# Patient Record
Sex: Male | Born: 1960
Health system: Southern US, Community
[De-identification: ages and names within clinical notes are randomized; demographics above are authoritative.]

## PROBLEM LIST (undated history)

## (undated) DIAGNOSIS — G959 Disease of spinal cord, unspecified: Secondary | ICD-10-CM

## (undated) DIAGNOSIS — K649 Unspecified hemorrhoids: Secondary | ICD-10-CM

## (undated) DIAGNOSIS — F329 Major depressive disorder, single episode, unspecified: Secondary | ICD-10-CM

## (undated) DIAGNOSIS — F32A Depression, unspecified: Secondary | ICD-10-CM

## (undated) DIAGNOSIS — D869 Sarcoidosis, unspecified: Secondary | ICD-10-CM

## (undated) DIAGNOSIS — M1990 Unspecified osteoarthritis, unspecified site: Secondary | ICD-10-CM

## (undated) DIAGNOSIS — F419 Anxiety disorder, unspecified: Secondary | ICD-10-CM

## (undated) DIAGNOSIS — K6289 Other specified diseases of anus and rectum: Secondary | ICD-10-CM

## (undated) HISTORY — DX: Other specified diseases of anus and rectum: K62.89

## (undated) HISTORY — PX: COLONOSCOPY: SHX5424

## (undated) HISTORY — DX: Sarcoidosis, unspecified: D86.9

## (undated) HISTORY — PX: CARDIAC CATHETERIZATION: SHX172

## (undated) HISTORY — DX: Unspecified hemorrhoids: K64.9

## (undated) HISTORY — DX: Disease of spinal cord, unspecified: G95.9

## (undated) HISTORY — PX: HAND TENDON SURGERY: SHX663

## (undated) HISTORY — PX: HEMORRHOID SURGERY: SHX153

## (undated) HISTORY — PX: ANTERIOR CRUCIATE LIGAMENT REPAIR: SHX115

## (undated) HISTORY — DX: Unspecified osteoarthritis, unspecified site: M19.90

---

## 2004-04-07 ENCOUNTER — Ambulatory Visit (HOSPITAL_COMMUNITY): Admission: RE | Admit: 2004-04-07 | Discharge: 2004-04-07 | Payer: Self-pay

## 2004-04-07 ENCOUNTER — Encounter (INDEPENDENT_AMBULATORY_CARE_PROVIDER_SITE_OTHER): Payer: Self-pay | Admitting: Specialist

## 2004-12-01 ENCOUNTER — Inpatient Hospital Stay (HOSPITAL_COMMUNITY): Admission: RE | Admit: 2004-12-01 | Discharge: 2004-12-02 | Payer: Self-pay | Admitting: Neurosurgery

## 2005-03-12 HISTORY — PX: BACK SURGERY: SHX140

## 2005-03-12 HISTORY — PX: SPHINCTEROTOMY: SHX5279

## 2008-11-29 ENCOUNTER — Ambulatory Visit (HOSPITAL_BASED_OUTPATIENT_CLINIC_OR_DEPARTMENT_OTHER): Admission: RE | Admit: 2008-11-29 | Discharge: 2008-11-29 | Payer: Self-pay | Admitting: General Surgery

## 2008-11-29 ENCOUNTER — Encounter (INDEPENDENT_AMBULATORY_CARE_PROVIDER_SITE_OTHER): Payer: Self-pay | Admitting: General Surgery

## 2008-12-28 ENCOUNTER — Encounter: Admission: RE | Admit: 2008-12-28 | Discharge: 2008-12-28 | Payer: Self-pay | Admitting: Neurosurgery

## 2009-07-08 ENCOUNTER — Encounter: Admission: RE | Admit: 2009-07-08 | Discharge: 2009-07-08 | Payer: Self-pay | Admitting: Neurosurgery

## 2009-11-25 ENCOUNTER — Encounter: Admission: RE | Admit: 2009-11-25 | Discharge: 2009-11-25 | Payer: Self-pay | Admitting: Neurosurgery

## 2010-06-16 LAB — POCT I-STAT 4, (NA,K, GLUC, HGB,HCT)
Hemoglobin: 15.6 g/dL (ref 13.0–17.0)
Potassium: 3.9 mEq/L (ref 3.5–5.1)
Sodium: 139 mEq/L (ref 135–145)

## 2010-07-28 NOTE — H&P (Signed)
NAME:  Adam Burnett, Adam Burnett              ACCOUNT NO.:  o   MEDICAL RECORD NO.:  [id number]          PATIENT TYPE:  INP   LOCATION:  NA                           FACILITY:  [facility]   PHYSICIAN:  Hilda Lias, M.D.   DATE OF BIRTH:  [birth year]   DATE OF ADMISSION:  12/01/2004  DATE OF DISCHARGE:                                HISTORY & PHYSICAL   HISTORY:  Adam Burnett is a gentleman who had been seen by me for more  than one year, complaining of back pain which gets better with conservative  treatment.  Lately the pain is more intense.  It is going down to the left  leg, associated with weakness and a burning sensation.  He is miserable.  In  the past he has had conservative treatment.  I saw him on September 27, 2003.  At  that time we made a diagnosis of degenerative disk disease at the level of  L4-5 with a herniated disk at the level of L5-S1.  He continues to come to  my office.  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  Nevertheless, he came to see me about three  weeks ago with worsening of the pain, down the left leg.  The x-rays showed  that he has a herniated disk at the level of L4-5, intra and extra-  foraminal, as well as foraminal stenosis at the level of L5-S1 on the left  side.  He came to the point that he was worse and wants to proceed with  surgery as soon as possible.   PAST MEDICAL HISTORY:  Negative.   ALLERGIES:  PENICILLIN.   SOCIAL HISTORY:  He smokes one pack a day.   REVIEW OF SYSTEMS:  Positive for back pain and left leg pain and anxiety.   PHYSICAL EXAMINATION:  HEENT:  Ears/Nose/Throat normal.  NECK:  Normal.  LUNGS:  Clear.  HEART:  Sounds normal.  ABDOMEN:  Normal.  EXTREMITIES:  Normal pulses.  NEUROLOGIC:  He has weakening of the dorsiflexion of the left foot which is  3/5, with straight leg raising being positive  at 45 degrees on the left  side.  On the right side is about 80 degrees.  He has a decrease of the left  ankle jerk.   The x-rays show that he has a herniated disk at the level of 4-5 intra-  foraminal as well as extra-foraminal.   CLINICAL IMPRESSION:  Left L4-5 herniated disk with intra-foraminal and  extra-foraminal component, left L5-S1 foraminal stenosis.   RECOMMENDATIONS:  The patient is being admitted for an L4-5 diskectomy with  intra-foraminal and extra-foraminal approach, as well as an L5 foraminotomy.  He knows about the risks such as infection, CSF leak, worsening of the pain,  paralysis, no improvement whatsoever because of the chronicity of the pain.           ______________________________  Hilda Lias, M.D.     EB/MEDQ  D:  12/01/2004  T:  12/01/2004  Job:  [id number]

## 2010-07-28 NOTE — Op Note (Signed)
NAME:  Adam Burnett, Adam Burnett NO.:  [id number]   MEDICAL RECORD NO.:  [id number]          PATIENT TYPE:  AMB   LOCATION:  DAY                          FACILITY:  [facility]   PHYSICIAN:  Lorre Munroe., M.D.DATE OF BIRTH:  [birth year]   DATE OF PROCEDURE:  04/07/2004  DATE OF DISCHARGE:                                 OPERATIVE REPORT   PREOPERATIVE DIAGNOSIS:  Symptomatic hemorrhoids.   POSTOPERATIVE DIAGNOSIS:  Symptomatic hemorrhoids.   OPERATION/PROCEDURE:  Hemorrhoidecomy.   SURGEON:  Lebron Conners, M.D.   ANESTHESIA:  General and local.   DESCRIPTION OF PROCEDURE:  The patient was anesthetized and placed in the  dorsal lithotomy position and perineum prepped and draped.  I thoroughly  examined the anal canal.  There were very small noninflamed, nonthrombosed  external skin tags.  There was a large hemorrhoid just on the right side  posteriorly and associated with it was a smaller one just adjacent to it.  There was also a sizable anterior hemorrhoid on the right.  We grasped the  largest of the hemorrhoids with a Kelly clamp across it longitudinally and  amputated it and oversewed the wound with 2-0 chromic stitch.  I treated the  anterior hemorrhoid in the same way and I treated the small hemorrhoid with  rubber-band ligation.  This did not narrow the anus at all.  I saw no  evidence of any inflammatory disease, fissure or infection.  I thoroughly  infiltrated the entire anal area, in particularly the area of the  hemorrhoids with long-acting local anesthetic.  The patient tolerated the  operation well.      WB/MEDQ  D:  04/07/2004  T:  04/07/2004  Job:  [id number]

## 2010-07-28 NOTE — Op Note (Signed)
NAMEMarland Kitchen  Burnett, Adam Burnett NO.:  [id number]   MEDICAL RECORD NO.:  [id number]          PATIENT TYPE:  INP   LOCATION:  3005                         FACILITY:  [facility]   PHYSICIAN:  Hilda Lias, M.D.   DATE OF BIRTH:  1960/03/30   DATE OF PROCEDURE:  12/01/2004  DATE OF DISCHARGE:                                 OPERATIVE REPORT   PREOPERATIVE DIAGNOSES:  1.  Left lumbar 4-5 herniated disk with intra- and extraforaminal component.  2.  Left lumbar 5, sacral 1 stenosis.   POSTOPERATIVE DIAGNOSES:  1.  Left lumbar 4-5 herniated disk with intra- and extraforaminal      components.  2.  Left lumbar 5, sacral 1 stenosis.   OPERATION PERFORMED:  1.  Left lumbar 4-5 diskectomy with intra- and extraforaminal approach.  2.  Left lumbar 5, sacral 1 foraminotomy.  3.  Microscopic surgery.   SURGEON:  Hilda Lias, M.D.   ASSISTANT:  Coletta Memos, M.D.   CLINICAL HISTORY:  The patient has been complaining of back pain radiating  to the left leg for more than two years.  I have been following him in my  office, but lately he has been getting worse, therefore he decided to  undergo the surgery.  The risks were explained __________ .   DESCRIPTION OF OPERATION:  The patient was brought to the OR and he was  positioned on the __________ .  The gentleman was quite heavy and it was  difficult to feel the spinous process.  Then a midline incision was made and  the muscles were retracted laterally.  We took an x-ray and this showed we  were at the L4-5 level.  From then on with the microscope we drilled the  lamina; the upper of L4 and the lower of L5.  The yellow ligament was also  excised.  Then we went extraforaminal and we drilled the inferior and  lateral facets.  The inner transverse ligament was also excised.  We  identified the L4 nerve root and from this side we identified the L5 nerve  root.  We opened the disk space at the L4-5 intraforaminal level and  __________   amount of degenerative disk was removed.  Then we went  laterally.  We retracted the L4 nerve root and we went into the disk.  The  disk was calcified and we had to drill it as well as remove it with the  curet and the Kerrison punch.  At the end we had a good decompression of the  intraforaminal.  Then we went laterally into the L5, S1 interspace. From  removal of the upper part of S1 was done.  Thick ligament was also excised.  With the drill as well as the 2 and 3 mm Kerrison punches foraminotomies to  decompress the S1 nerve root were obtained.  At the end we had a good  decompression.   Hemostasis was done with bipolar electrocautery.  The area was irrigated.  Fentanyl and Depo-Medrol were left in the epidural space.  The wound was  closed with Vicryl and Steri-strips.  ______________________________  Hilda Lias, M.D.     EB/MEDQ  D:  12/01/2004  T:  12/02/2004  Job:  [id number]

## 2012-01-18 ENCOUNTER — Ambulatory Visit (INDEPENDENT_AMBULATORY_CARE_PROVIDER_SITE_OTHER): Payer: Self-pay | Admitting: General Surgery

## 2012-01-18 ENCOUNTER — Encounter (INDEPENDENT_AMBULATORY_CARE_PROVIDER_SITE_OTHER): Payer: Self-pay | Admitting: General Surgery

## 2012-01-18 VITALS — BP 132/84 | HR 80 | Temp 97.6°F | Resp 14 | Ht 74.0 in | Wt 202.8 lb

## 2012-01-18 DIAGNOSIS — K649 Unspecified hemorrhoids: Secondary | ICD-10-CM

## 2012-01-18 MED ORDER — HYDROCORTISONE ACETATE 25 MG RE SUPP: 25.0000 mg | Freq: Two times a day (BID) | RECTAL | Status: DC

## 2012-01-18 NOTE — Progress Notes (Signed)
Subjective:     Patient ID: Adam Burnett, male   DOB: [birth year], 51 y.o.   MRN: [id number]  HPI The patient is a 51 year old male who presented on 2 previous hemorrhoid surgeries. Patient is also lateral internal sphincterotomy by Dr. Zachery Dakins for an anal fissure.the patient states that approximately 2 months ago patient began having problems with hemorrhoids again and pain on defecation. Patient states he is normal regular without limits takes milk of magnesia every night and has normal bowel movements the morning after.  Review of Systems  Constitutional: Negative.   HENT: Negative.   Eyes: Negative.   Respiratory: Negative.   Cardiovascular: Negative.   Gastrointestinal: Negative.   Musculoskeletal: Negative.   Neurological: Negative.        Objective:   Physical Exam  Constitutional: He is oriented to person, place, and time. He appears well-developed and well-nourished.  HENT:  Head: Normocephalic and atraumatic.  Eyes: Conjunctivae normal and EOM are normal. Pupils are equal, round, and reactive to light.  Neck: Normal range of motion. Neck supple.  Cardiovascular: Normal rate and regular rhythm.   Pulmonary/Chest: Effort normal and breath sounds normal.  Abdominal: Soft. Bowel sounds are normal.  Genitourinary:     Musculoskeletal: Normal range of motion.  Neurological: He is alert and oriented to person, place, and time.       Assessment:     51 year old male with symptomatic hemorrhoids and redundant hemorrhoid tissue.    Plan:     1. At this point secondary to pain all prescribed Anusol-HC suppository.  2. This is the patient should  His hemorrhoid issues continue to become symptomatic patient may require excision of this hemorrhoid tissue. He stated that he would like to be seen next year secondary to insurance purposes.the patient followed Dr. Maisie Fus for surgical referral.

## 2012-02-04 ENCOUNTER — Other Ambulatory Visit (INDEPENDENT_AMBULATORY_CARE_PROVIDER_SITE_OTHER): Payer: Self-pay

## 2012-02-04 ENCOUNTER — Telehealth (INDEPENDENT_AMBULATORY_CARE_PROVIDER_SITE_OTHER): Payer: Self-pay

## 2012-02-04 DIAGNOSIS — K649 Unspecified hemorrhoids: Secondary | ICD-10-CM

## 2012-02-04 MED ORDER — HYDROCORTISONE ACETATE 25 MG RE SUPP: 25.0000 mg | Freq: Two times a day (BID) | RECTAL | Status: DC

## 2012-02-04 NOTE — Progress Notes (Signed)
Anusol refilled authorized by Dr. Derrell Lolling on 02/04/12.

## 2012-02-04 NOTE — Telephone Encounter (Signed)
Patient notified of refill for Anusol, refill sent to [facility] in [place], [place]

## 2015-07-06 DIAGNOSIS — M25561 Pain in right knee: Secondary | ICD-10-CM | POA: Diagnosis not present

## 2015-07-06 DIAGNOSIS — M1711 Unilateral primary osteoarthritis, right knee: Secondary | ICD-10-CM | POA: Diagnosis not present

## 2015-07-06 DIAGNOSIS — S83511S Sprain of anterior cruciate ligament of right knee, sequela: Secondary | ICD-10-CM | POA: Diagnosis not present

## 2015-09-06 DIAGNOSIS — F329 Major depressive disorder, single episode, unspecified: Secondary | ICD-10-CM | POA: Diagnosis not present

## 2015-09-06 DIAGNOSIS — Z6826 Body mass index (BMI) 26.0-26.9, adult: Secondary | ICD-10-CM | POA: Diagnosis not present

## 2015-09-06 DIAGNOSIS — R5382 Chronic fatigue, unspecified: Secondary | ICD-10-CM | POA: Diagnosis not present

## 2015-09-06 DIAGNOSIS — M5137 Other intervertebral disc degeneration, lumbosacral region: Secondary | ICD-10-CM | POA: Diagnosis not present

## 2015-09-06 DIAGNOSIS — E663 Overweight: Secondary | ICD-10-CM | POA: Diagnosis not present

## 2015-09-29 DIAGNOSIS — F418 Other specified anxiety disorders: Secondary | ICD-10-CM | POA: Diagnosis not present

## 2015-09-29 DIAGNOSIS — Z6826 Body mass index (BMI) 26.0-26.9, adult: Secondary | ICD-10-CM | POA: Diagnosis not present

## 2015-10-27 DIAGNOSIS — M9983 Other biomechanical lesions of lumbar region: Secondary | ICD-10-CM | POA: Diagnosis not present

## 2015-10-27 DIAGNOSIS — Z79891 Long term (current) use of opiate analgesic: Secondary | ICD-10-CM | POA: Diagnosis not present

## 2015-10-27 DIAGNOSIS — M5137 Other intervertebral disc degeneration, lumbosacral region: Secondary | ICD-10-CM | POA: Diagnosis not present

## 2015-10-27 DIAGNOSIS — F418 Other specified anxiety disorders: Secondary | ICD-10-CM | POA: Diagnosis not present

## 2015-12-28 DIAGNOSIS — R079 Chest pain, unspecified: Secondary | ICD-10-CM | POA: Diagnosis not present

## 2015-12-28 DIAGNOSIS — Z6828 Body mass index (BMI) 28.0-28.9, adult: Secondary | ICD-10-CM | POA: Diagnosis not present

## 2015-12-30 DIAGNOSIS — R079 Chest pain, unspecified: Secondary | ICD-10-CM | POA: Diagnosis not present

## 2016-01-03 DIAGNOSIS — M5412 Radiculopathy, cervical region: Secondary | ICD-10-CM | POA: Diagnosis not present

## 2016-01-03 DIAGNOSIS — I519 Heart disease, unspecified: Secondary | ICD-10-CM | POA: Diagnosis not present

## 2016-01-03 DIAGNOSIS — Z6828 Body mass index (BMI) 28.0-28.9, adult: Secondary | ICD-10-CM | POA: Diagnosis not present

## 2016-01-03 DIAGNOSIS — F419 Anxiety disorder, unspecified: Secondary | ICD-10-CM | POA: Diagnosis not present

## 2016-02-14 DIAGNOSIS — Z6828 Body mass index (BMI) 28.0-28.9, adult: Secondary | ICD-10-CM | POA: Diagnosis not present

## 2016-02-14 DIAGNOSIS — Z862 Personal history of diseases of the blood and blood-forming organs and certain disorders involving the immune mechanism: Secondary | ICD-10-CM | POA: Diagnosis not present

## 2016-02-14 DIAGNOSIS — D869 Sarcoidosis, unspecified: Secondary | ICD-10-CM | POA: Diagnosis not present

## 2016-02-14 DIAGNOSIS — R079 Chest pain, unspecified: Secondary | ICD-10-CM | POA: Diagnosis not present

## 2016-02-14 DIAGNOSIS — I10 Essential (primary) hypertension: Secondary | ICD-10-CM | POA: Diagnosis not present

## 2016-02-21 DIAGNOSIS — Z7982 Long term (current) use of aspirin: Secondary | ICD-10-CM | POA: Diagnosis not present

## 2016-02-21 DIAGNOSIS — Z86010 Personal history of colonic polyps: Secondary | ICD-10-CM | POA: Diagnosis not present

## 2016-02-21 DIAGNOSIS — I119 Hypertensive heart disease without heart failure: Secondary | ICD-10-CM | POA: Diagnosis not present

## 2016-02-21 DIAGNOSIS — Z880 Allergy status to penicillin: Secondary | ICD-10-CM | POA: Diagnosis not present

## 2016-02-21 DIAGNOSIS — F17210 Nicotine dependence, cigarettes, uncomplicated: Secondary | ICD-10-CM | POA: Diagnosis not present

## 2016-02-21 DIAGNOSIS — I10 Essential (primary) hypertension: Secondary | ICD-10-CM | POA: Diagnosis not present

## 2016-02-21 DIAGNOSIS — J449 Chronic obstructive pulmonary disease, unspecified: Secondary | ICD-10-CM | POA: Diagnosis not present

## 2016-02-21 DIAGNOSIS — R0789 Other chest pain: Secondary | ICD-10-CM | POA: Diagnosis not present

## 2016-02-21 DIAGNOSIS — D869 Sarcoidosis, unspecified: Secondary | ICD-10-CM | POA: Diagnosis not present

## 2016-05-17 DIAGNOSIS — Z6828 Body mass index (BMI) 28.0-28.9, adult: Secondary | ICD-10-CM | POA: Diagnosis not present

## 2016-05-17 DIAGNOSIS — J208 Acute bronchitis due to other specified organisms: Secondary | ICD-10-CM | POA: Diagnosis not present

## 2016-06-07 DIAGNOSIS — J208 Acute bronchitis due to other specified organisms: Secondary | ICD-10-CM | POA: Diagnosis not present

## 2016-08-29 DIAGNOSIS — Z6828 Body mass index (BMI) 28.0-28.9, adult: Secondary | ICD-10-CM | POA: Diagnosis not present

## 2016-08-29 DIAGNOSIS — M5137 Other intervertebral disc degeneration, lumbosacral region: Secondary | ICD-10-CM | POA: Diagnosis not present

## 2016-11-05 DIAGNOSIS — M5137 Other intervertebral disc degeneration, lumbosacral region: Secondary | ICD-10-CM | POA: Diagnosis not present

## 2016-11-05 DIAGNOSIS — Z6827 Body mass index (BMI) 27.0-27.9, adult: Secondary | ICD-10-CM | POA: Diagnosis not present

## 2016-11-17 DIAGNOSIS — M5137 Other intervertebral disc degeneration, lumbosacral region: Secondary | ICD-10-CM | POA: Diagnosis not present

## 2016-11-17 DIAGNOSIS — M48061 Spinal stenosis, lumbar region without neurogenic claudication: Secondary | ICD-10-CM | POA: Diagnosis not present

## 2016-12-05 DIAGNOSIS — M4727 Other spondylosis with radiculopathy, lumbosacral region: Secondary | ICD-10-CM | POA: Diagnosis not present

## 2016-12-05 DIAGNOSIS — M5416 Radiculopathy, lumbar region: Secondary | ICD-10-CM | POA: Diagnosis not present

## 2016-12-05 DIAGNOSIS — M5127 Other intervertebral disc displacement, lumbosacral region: Secondary | ICD-10-CM | POA: Diagnosis not present

## 2016-12-05 DIAGNOSIS — M549 Dorsalgia, unspecified: Secondary | ICD-10-CM | POA: Diagnosis not present

## 2016-12-05 DIAGNOSIS — M4150 Other secondary scoliosis, site unspecified: Secondary | ICD-10-CM | POA: Diagnosis not present

## 2016-12-25 ENCOUNTER — Other Ambulatory Visit (HOSPITAL_COMMUNITY): Payer: Self-pay | Admitting: Neurological Surgery

## 2016-12-25 DIAGNOSIS — M4180 Other forms of scoliosis, site unspecified: Secondary | ICD-10-CM

## 2016-12-25 DIAGNOSIS — M4150 Other secondary scoliosis, site unspecified: Principal | ICD-10-CM

## 2016-12-31 ENCOUNTER — Ambulatory Visit (HOSPITAL_COMMUNITY)
Admission: RE | Admit: 2016-12-31 | Discharge: 2016-12-31 | Disposition: A | Discharge status: Home / Self Care | Payer: BLUE CROSS/BLUE SHIELD | Source: Ambulatory Visit | Attending: Neurological Surgery | Admitting: Neurological Surgery

## 2016-12-31 DIAGNOSIS — M5126 Other intervertebral disc displacement, lumbar region: Secondary | ICD-10-CM | POA: Diagnosis not present

## 2016-12-31 DIAGNOSIS — M48061 Spinal stenosis, lumbar region without neurogenic claudication: Secondary | ICD-10-CM | POA: Insufficient documentation

## 2016-12-31 DIAGNOSIS — M47814 Spondylosis without myelopathy or radiculopathy, thoracic region: Secondary | ICD-10-CM | POA: Diagnosis not present

## 2016-12-31 DIAGNOSIS — M4807 Spinal stenosis, lumbosacral region: Secondary | ICD-10-CM | POA: Diagnosis not present

## 2016-12-31 DIAGNOSIS — M4150 Other secondary scoliosis, site unspecified: Secondary | ICD-10-CM | POA: Insufficient documentation

## 2017-01-04 DIAGNOSIS — Z6827 Body mass index (BMI) 27.0-27.9, adult: Secondary | ICD-10-CM | POA: Diagnosis not present

## 2017-01-04 DIAGNOSIS — M5137 Other intervertebral disc degeneration, lumbosacral region: Secondary | ICD-10-CM | POA: Diagnosis not present

## 2017-01-10 DIAGNOSIS — M5127 Other intervertebral disc displacement, lumbosacral region: Secondary | ICD-10-CM | POA: Diagnosis not present

## 2017-01-10 DIAGNOSIS — M5416 Radiculopathy, lumbar region: Secondary | ICD-10-CM | POA: Diagnosis not present

## 2017-01-10 DIAGNOSIS — M4150 Other secondary scoliosis, site unspecified: Secondary | ICD-10-CM | POA: Diagnosis not present

## 2017-01-10 DIAGNOSIS — M4727 Other spondylosis with radiculopathy, lumbosacral region: Secondary | ICD-10-CM | POA: Diagnosis not present

## 2017-01-25 ENCOUNTER — Other Ambulatory Visit: Payer: Self-pay

## 2017-01-25 ENCOUNTER — Encounter (HOSPITAL_COMMUNITY): Payer: Self-pay | Admitting: *Deleted

## 2017-01-25 NOTE — Progress Notes (Signed)
Patient verbalized understanding of instructions   7 days prior to surgery STOP taking any Aspirin(unless otherwise instructed by your surgeon), Aleve, Naproxen, Ibuprofen, Motrin, Advil, Goody's, BC's, all herbal medications, fish oil, and all vitamins

## 2017-01-28 ENCOUNTER — Inpatient Hospital Stay (HOSPITAL_COMMUNITY)
Admission: RE | Admit: 2017-01-28 | Discharge: 2017-01-30 | DRG: 460 | Disposition: A | Discharge status: Home / Self Care | Payer: BLUE CROSS/BLUE SHIELD | Source: Ambulatory Visit | Attending: Neurological Surgery | Admitting: Neurological Surgery

## 2017-01-28 ENCOUNTER — Inpatient Hospital Stay (HOSPITAL_COMMUNITY): Payer: BLUE CROSS/BLUE SHIELD | Admitting: Certified Registered Nurse Anesthetist

## 2017-01-28 ENCOUNTER — Encounter (HOSPITAL_COMMUNITY): Payer: Self-pay

## 2017-01-28 ENCOUNTER — Other Ambulatory Visit: Payer: Self-pay

## 2017-01-28 ENCOUNTER — Encounter (HOSPITAL_COMMUNITY)
Admission: RE | Disposition: A | Discharge status: Home / Self Care | Payer: Self-pay | Source: Ambulatory Visit | Attending: Neurological Surgery

## 2017-01-28 ENCOUNTER — Inpatient Hospital Stay (HOSPITAL_COMMUNITY): Payer: BLUE CROSS/BLUE SHIELD

## 2017-01-28 DIAGNOSIS — Z880 Allergy status to penicillin: Secondary | ICD-10-CM

## 2017-01-28 DIAGNOSIS — M48061 Spinal stenosis, lumbar region without neurogenic claudication: Secondary | ICD-10-CM | POA: Diagnosis not present

## 2017-01-28 DIAGNOSIS — F329 Major depressive disorder, single episode, unspecified: Secondary | ICD-10-CM | POA: Diagnosis not present

## 2017-01-28 DIAGNOSIS — M4727 Other spondylosis with radiculopathy, lumbosacral region: Principal | ICD-10-CM | POA: Diagnosis present

## 2017-01-28 DIAGNOSIS — F17210 Nicotine dependence, cigarettes, uncomplicated: Secondary | ICD-10-CM | POA: Diagnosis not present

## 2017-01-28 DIAGNOSIS — M4326 Fusion of spine, lumbar region: Secondary | ICD-10-CM | POA: Diagnosis not present

## 2017-01-28 DIAGNOSIS — M549 Dorsalgia, unspecified: Secondary | ICD-10-CM | POA: Diagnosis not present

## 2017-01-28 DIAGNOSIS — F419 Anxiety disorder, unspecified: Secondary | ICD-10-CM | POA: Diagnosis not present

## 2017-01-28 DIAGNOSIS — Z419 Encounter for procedure for purposes other than remedying health state, unspecified: Secondary | ICD-10-CM

## 2017-01-28 HISTORY — PX: TRANSFORAMINAL LUMBAR INTERBODY FUSION (TLIF) WITH PEDICLE SCREW FIXATION 3 LEVEL: SHX6143

## 2017-01-28 HISTORY — DX: Depression, unspecified: F32.A

## 2017-01-28 HISTORY — DX: Major depressive disorder, single episode, unspecified: F32.9

## 2017-01-28 HISTORY — PX: APPLICATION OF ROBOTIC ASSISTANCE FOR SPINAL PROCEDURE: SHX6753

## 2017-01-28 HISTORY — PX: ANTERIOR LAT LUMBAR FUSION: SHX1168

## 2017-01-28 HISTORY — DX: Anxiety disorder, unspecified: F41.9

## 2017-01-28 LAB — TYPE AND SCREEN
ABO/RH(D): O POS
Antibody Screen: NEGATIVE

## 2017-01-28 LAB — BASIC METABOLIC PANEL
ANION GAP: 6 (ref 5–15)
BUN: 14 mg/dL (ref 6–20)
CALCIUM: 8.6 mg/dL — AB (ref 8.9–10.3)
CO2: 27 mmol/L (ref 22–32)
Chloride: 102 mmol/L (ref 101–111)
Creatinine, Ser: 0.86 mg/dL (ref 0.61–1.24)
GFR calc Af Amer: >60 mL/min (ref >60)
Glucose, Bld: 100 mg/dL — ABNORMAL HIGH (ref 65–99)
POTASSIUM: 4.2 mmol/L (ref 3.5–5.1)
SODIUM: 135 mmol/L (ref 135–145)

## 2017-01-28 LAB — CBC
HCT: 41.1 % (ref 39.0–52.0)
Hemoglobin: 13.7 g/dL (ref 13.0–17.0)
MCH: 31.8 pg (ref 26.0–34.0)
MCHC: 33.3 g/dL (ref 30.0–36.0)
MCV: 95.4 fL (ref 78.0–100.0)
PLATELETS: 404 10*3/uL — AB (ref 150–400)
RBC: 4.31 MIL/uL (ref 4.22–5.81)
RDW: 15 % (ref 11.5–15.5)
WBC: 10.4 10*3/uL (ref 4.0–10.5)

## 2017-01-28 LAB — ABO/RH: ABO/RH(D): O POS

## 2017-01-28 SURGERY — ANTERIOR LATERAL LUMBAR FUSION 2 LEVELS
Anesthesia: General

## 2017-01-28 MED ORDER — MIDAZOLAM HCL 2 MG/2ML IJ SOLN
INTRAMUSCULAR | Status: DC | PRN
  Administered 2017-01-28 (×2): 1 mg via INTRAVENOUS

## 2017-01-28 MED ORDER — SUCCINYLCHOLINE CHLORIDE 200 MG/10ML IV SOSY
PREFILLED_SYRINGE | INTRAVENOUS | Status: AC
  Filled 2017-01-28: qty 10

## 2017-01-28 MED ORDER — SODIUM CHLORIDE 0.9% FLUSH: 3.0000 mL | INTRAVENOUS | Status: DC | PRN

## 2017-01-28 MED ORDER — 0.9 % SODIUM CHLORIDE (POUR BTL) OPTIME
TOPICAL | Status: DC | PRN
  Administered 2017-01-28: 1000 mL

## 2017-01-28 MED ORDER — VANCOMYCIN HCL IN DEXTROSE 1-5 GM/200ML-% IV SOLN
INTRAVENOUS | Status: AC
  Filled 2017-01-28: qty 200

## 2017-01-28 MED ORDER — SODIUM CHLORIDE 0.9 % IR SOLN
Status: DC | PRN
  Administered 2017-01-28: 09:00:00

## 2017-01-28 MED ORDER — LIDOCAINE 2% (20 MG/ML) 5 ML SYRINGE
INTRAMUSCULAR | Status: DC | PRN
  Administered 2017-01-28: 60 mg via INTRAVENOUS

## 2017-01-28 MED ORDER — PROPOFOL 500 MG/50ML IV EMUL
INTRAVENOUS | Status: DC | PRN
  Administered 2017-01-28: 100 ug/kg/min via INTRAVENOUS

## 2017-01-28 MED ORDER — OXYCODONE HCL ER 20 MG PO T12A
20.0000 mg | EXTENDED_RELEASE_TABLET | Freq: Two times a day (BID) | ORAL | Status: DC
  Administered 2017-01-28 – 2017-01-30 (×4): 20 mg via ORAL
  Filled 2017-01-28 (×4): qty 1

## 2017-01-28 MED ORDER — VENLAFAXINE HCL ER 75 MG PO CP24
75.0000 mg | ORAL_CAPSULE | Freq: Every day | ORAL | Status: DC
  Administered 2017-01-29 – 2017-01-30 (×2): 75 mg via ORAL
  Filled 2017-01-28 (×2): qty 1

## 2017-01-28 MED ORDER — ROCURONIUM BROMIDE 10 MG/ML (PF) SYRINGE
PREFILLED_SYRINGE | INTRAVENOUS | Status: DC | PRN
  Administered 2017-01-28: 50 mg via INTRAVENOUS
  Administered 2017-01-28: 10 mg via INTRAVENOUS
  Administered 2017-01-28: 30 mg via INTRAVENOUS
  Administered 2017-01-28 (×2): 20 mg via INTRAVENOUS

## 2017-01-28 MED ORDER — LACTATED RINGERS IV SOLN
INTRAVENOUS | Status: DC | PRN
  Administered 2017-01-28: 07:00:00 via INTRAVENOUS

## 2017-01-28 MED ORDER — HYDROMORPHONE HCL 1 MG/ML IJ SOLN
INTRAMUSCULAR | Status: AC
  Filled 2017-01-28: qty 1

## 2017-01-28 MED ORDER — HYDROMORPHONE HCL 1 MG/ML IJ SOLN
0.2500 mg | INTRAMUSCULAR | Status: DC | PRN
  Administered 2017-01-28: 0.25 mg via INTRAVENOUS
  Administered 2017-01-28: 0.5 mg via INTRAVENOUS
  Administered 2017-01-28: 0.25 mg via INTRAVENOUS

## 2017-01-28 MED ORDER — ALBUMIN HUMAN 5 % IV SOLN
INTRAVENOUS | Status: DC | PRN
  Administered 2017-01-28 (×2): via INTRAVENOUS

## 2017-01-28 MED ORDER — LIDOCAINE 2% (20 MG/ML) 5 ML SYRINGE
INTRAMUSCULAR | Status: AC
  Filled 2017-01-28: qty 5

## 2017-01-28 MED ORDER — DOCUSATE SODIUM 100 MG PO CAPS
100.0000 mg | ORAL_CAPSULE | Freq: Two times a day (BID) | ORAL | Status: DC
  Administered 2017-01-28 – 2017-01-30 (×4): 100 mg via ORAL
  Filled 2017-01-28 (×4): qty 1

## 2017-01-28 MED ORDER — SODIUM CHLORIDE 0.9% FLUSH
3.0000 mL | Freq: Two times a day (BID) | INTRAVENOUS | Status: DC
  Administered 2017-01-28 – 2017-01-30 (×4): 3 mL via INTRAVENOUS

## 2017-01-28 MED ORDER — PHENYLEPHRINE HCL 10 MG/ML IJ SOLN
INTRAVENOUS | Status: DC | PRN
  Administered 2017-01-28: 20 ug/min via INTRAVENOUS

## 2017-01-28 MED ORDER — OXYCODONE HCL 5 MG/5ML PO SOLN: 5.0000 mg | Freq: Once | ORAL | Status: DC | PRN

## 2017-01-28 MED ORDER — THROMBIN (RECOMBINANT) 5000 UNITS EX SOLR
CUTANEOUS | Status: AC
  Filled 2017-01-28: qty 10000

## 2017-01-28 MED ORDER — SUGAMMADEX SODIUM 200 MG/2ML IV SOLN
INTRAVENOUS | Status: DC | PRN
  Administered 2017-01-28: 200 mg via INTRAVENOUS

## 2017-01-28 MED ORDER — FENTANYL CITRATE (PF) 250 MCG/5ML IJ SOLN
INTRAMUSCULAR | Status: DC | PRN
  Administered 2017-01-28: 50 ug via INTRAVENOUS
  Administered 2017-01-28: 100 ug via INTRAVENOUS
  Administered 2017-01-28: 50 ug via INTRAVENOUS
  Administered 2017-01-28: 150 ug via INTRAVENOUS
  Administered 2017-01-28 (×6): 50 ug via INTRAVENOUS
  Administered 2017-01-28: 100 ug via INTRAVENOUS

## 2017-01-28 MED ORDER — FENTANYL CITRATE (PF) 250 MCG/5ML IJ SOLN
INTRAMUSCULAR | Status: AC
  Filled 2017-01-28: qty 5

## 2017-01-28 MED ORDER — SODIUM CHLORIDE 0.9 % IV SOLN: 250.0000 mL | INTRAVENOUS | Status: DC

## 2017-01-28 MED ORDER — METHOCARBAMOL 500 MG PO TABS
750.0000 mg | ORAL_TABLET | Freq: Four times a day (QID) | ORAL | Status: DC
  Administered 2017-01-28 – 2017-01-29 (×6): 750 mg via ORAL
  Filled 2017-01-28 (×6): qty 2

## 2017-01-28 MED ORDER — VANCOMYCIN HCL IN DEXTROSE 1-5 GM/200ML-% IV SOLN
1000.0000 mg | Freq: Three times a day (TID) | INTRAVENOUS | Status: DC
  Administered 2017-01-28 – 2017-01-30 (×6): 1000 mg via INTRAVENOUS
  Filled 2017-01-28 (×7): qty 200

## 2017-01-28 MED ORDER — GABAPENTIN 300 MG PO CAPS
300.0000 mg | ORAL_CAPSULE | Freq: Three times a day (TID) | ORAL | Status: DC
  Administered 2017-01-28 – 2017-01-30 (×5): 300 mg via ORAL
  Filled 2017-01-28 (×5): qty 1

## 2017-01-28 MED ORDER — ARTIFICIAL TEARS OPHTHALMIC OINT
TOPICAL_OINTMENT | OPHTHALMIC | Status: DC | PRN
  Administered 2017-01-28: 1 via OPHTHALMIC

## 2017-01-28 MED ORDER — PANTOPRAZOLE SODIUM 40 MG IV SOLR
40.0000 mg | Freq: Every day | INTRAVENOUS | Status: DC
  Filled 2017-01-28: qty 40

## 2017-01-28 MED ORDER — ONDANSETRON HCL 4 MG/2ML IJ SOLN: 4.0000 mg | Freq: Once | INTRAMUSCULAR | Status: DC | PRN

## 2017-01-28 MED ORDER — VANCOMYCIN HCL 1000 MG IV SOLR
INTRAVENOUS | Status: DC | PRN
  Administered 2017-01-28: 1000 mg via INTRAVENOUS

## 2017-01-28 MED ORDER — SODIUM CHLORIDE 0.9 % IV SOLN
INTRAVENOUS | Status: DC | PRN
  Administered 2017-01-28: 14:00:00 via INTRAVENOUS

## 2017-01-28 MED ORDER — ONDANSETRON HCL 4 MG/2ML IJ SOLN
INTRAMUSCULAR | Status: AC
  Filled 2017-01-28: qty 2

## 2017-01-28 MED ORDER — PROPOFOL 1000 MG/100ML IV EMUL
INTRAVENOUS | Status: AC
  Filled 2017-01-28: qty 100

## 2017-01-28 MED ORDER — PROPOFOL 10 MG/ML IV BOLUS
INTRAVENOUS | Status: DC | PRN
  Administered 2017-01-28: 180 mg via INTRAVENOUS

## 2017-01-28 MED ORDER — EPHEDRINE 5 MG/ML INJ
INTRAVENOUS | Status: AC
  Filled 2017-01-28: qty 10

## 2017-01-28 MED ORDER — CELECOXIB 200 MG PO CAPS
200.0000 mg | ORAL_CAPSULE | Freq: Two times a day (BID) | ORAL | Status: DC
  Administered 2017-01-28 – 2017-01-30 (×4): 200 mg via ORAL
  Filled 2017-01-28 (×4): qty 1

## 2017-01-28 MED ORDER — BUPIVACAINE HCL (PF) 0.5 % IJ SOLN
INTRAMUSCULAR | Status: DC | PRN
  Administered 2017-01-28: 30 mL

## 2017-01-28 MED ORDER — BUPIVACAINE-EPINEPHRINE (PF) 0.5% -1:200000 IJ SOLN
INTRAMUSCULAR | Status: AC
  Filled 2017-01-28: qty 30

## 2017-01-28 MED ORDER — ACETAMINOPHEN 500 MG PO TABS
1000.0000 mg | ORAL_TABLET | Freq: Four times a day (QID) | ORAL | Status: DC
  Administered 2017-01-28 – 2017-01-30 (×6): 1000 mg via ORAL
  Filled 2017-01-28 (×7): qty 2

## 2017-01-28 MED ORDER — ROCURONIUM BROMIDE 10 MG/ML (PF) SYRINGE
PREFILLED_SYRINGE | INTRAVENOUS | Status: AC
  Filled 2017-01-28: qty 5

## 2017-01-28 MED ORDER — FLEET ENEMA 7-19 GM/118ML RE ENEM: 1.0000 | ENEMA | Freq: Once | RECTAL | Status: DC | PRN

## 2017-01-28 MED ORDER — BUPIVACAINE HCL (PF) 0.5 % IJ SOLN
INTRAMUSCULAR | Status: AC
  Filled 2017-01-28: qty 30

## 2017-01-28 MED ORDER — PHENYLEPHRINE 40 MCG/ML (10ML) SYRINGE FOR IV PUSH (FOR BLOOD PRESSURE SUPPORT)
PREFILLED_SYRINGE | INTRAVENOUS | Status: AC
  Filled 2017-01-28: qty 10

## 2017-01-28 MED ORDER — BUPIVACAINE LIPOSOME 1.3 % IJ SUSP
20.0000 mL | INTRAMUSCULAR | Status: AC
  Administered 2017-01-28: 20 mL
  Filled 2017-01-28: qty 20

## 2017-01-28 MED ORDER — OXYCODONE HCL 5 MG PO TABS
10.0000 mg | ORAL_TABLET | ORAL | Status: DC | PRN
  Administered 2017-01-28 – 2017-01-30 (×7): 10 mg via ORAL
  Filled 2017-01-28 (×7): qty 2

## 2017-01-28 MED ORDER — PHENYLEPHRINE 40 MCG/ML (10ML) SYRINGE FOR IV PUSH (FOR BLOOD PRESSURE SUPPORT)
PREFILLED_SYRINGE | INTRAVENOUS | Status: DC | PRN
  Administered 2017-01-28: 80 ug via INTRAVENOUS

## 2017-01-28 MED ORDER — SUCCINYLCHOLINE CHLORIDE 20 MG/ML IJ SOLN
INTRAMUSCULAR | Status: DC | PRN
  Administered 2017-01-28: 140 mg via INTRAVENOUS

## 2017-01-28 MED ORDER — OXYCODONE HCL 5 MG PO TABS: 5.0000 mg | ORAL_TABLET | Freq: Once | ORAL | Status: DC | PRN

## 2017-01-28 MED ORDER — OXYCODONE HCL 5 MG PO TABS: 5.0000 mg | ORAL_TABLET | ORAL | Status: DC | PRN

## 2017-01-28 MED ORDER — DIAZEPAM 5 MG PO TABS: 5.0000 mg | ORAL_TABLET | Freq: Four times a day (QID) | ORAL | Status: DC | PRN

## 2017-01-28 MED ORDER — MENTHOL 3 MG MT LOZG: 1.0000 | LOZENGE | OROMUCOSAL | Status: DC | PRN

## 2017-01-28 MED ORDER — SODIUM CHLORIDE 0.9 % IJ SOLN
INTRAMUSCULAR | Status: DC | PRN
  Administered 2017-01-28: 110 mL via INTRAVENOUS

## 2017-01-28 MED ORDER — DEXAMETHASONE SODIUM PHOSPHATE 10 MG/ML IJ SOLN
INTRAMUSCULAR | Status: AC
  Filled 2017-01-28: qty 1

## 2017-01-28 MED ORDER — DEXMEDETOMIDINE HCL IN NACL 200 MCG/50ML IV SOLN
INTRAVENOUS | Status: AC
  Filled 2017-01-28: qty 50

## 2017-01-28 MED ORDER — ONDANSETRON HCL 4 MG/2ML IJ SOLN
INTRAMUSCULAR | Status: DC | PRN
  Administered 2017-01-28: 4 mg via INTRAVENOUS

## 2017-01-28 MED ORDER — ONDANSETRON HCL 4 MG PO TABS: 4.0000 mg | ORAL_TABLET | Freq: Four times a day (QID) | ORAL | Status: DC | PRN

## 2017-01-28 MED ORDER — VANCOMYCIN HCL 1000 MG IV SOLR
INTRAVENOUS | Status: DC | PRN
  Administered 2017-01-28: 1000 mg

## 2017-01-28 MED ORDER — SODIUM CHLORIDE 0.9 % IV SOLN
INTRAVENOUS | Status: DC
  Administered 2017-01-28: 18:00:00 via INTRAVENOUS

## 2017-01-28 MED ORDER — BISACODYL 5 MG PO TBEC
5.0000 mg | DELAYED_RELEASE_TABLET | Freq: Every day | ORAL | Status: DC | PRN
Start: 2017-01-28 — End: 2017-01-30

## 2017-01-28 MED ORDER — GELATIN ABSORBABLE MT POWD
OROMUCOSAL | Status: DC | PRN
  Administered 2017-01-28 (×2): via TOPICAL

## 2017-01-28 MED ORDER — PROPOFOL 10 MG/ML IV BOLUS
INTRAVENOUS | Status: AC
  Filled 2017-01-28: qty 20

## 2017-01-28 MED ORDER — ROCURONIUM BROMIDE 10 MG/ML (PF) SYRINGE
PREFILLED_SYRINGE | INTRAVENOUS | Status: AC
  Filled 2017-01-28: qty 10

## 2017-01-28 MED ORDER — BUPIVACAINE-EPINEPHRINE 0.5% -1:200000 IJ SOLN
INTRAMUSCULAR | Status: DC | PRN
  Administered 2017-01-28: 5 mL

## 2017-01-28 MED ORDER — VANCOMYCIN HCL 1000 MG IV SOLR
INTRAVENOUS | Status: AC
  Filled 2017-01-28: qty 1000

## 2017-01-28 MED ORDER — SENNA 8.6 MG PO TABS
1.0000 | ORAL_TABLET | Freq: Two times a day (BID) | ORAL | Status: DC
  Administered 2017-01-28 – 2017-01-30 (×4): 8.6 mg via ORAL
  Filled 2017-01-28 (×4): qty 1

## 2017-01-28 MED ORDER — DEXAMETHASONE SODIUM PHOSPHATE 10 MG/ML IJ SOLN
INTRAMUSCULAR | Status: DC | PRN
  Administered 2017-01-28: 10 mg via INTRAVENOUS

## 2017-01-28 MED ORDER — PANTOPRAZOLE SODIUM 40 MG PO TBEC
40.0000 mg | DELAYED_RELEASE_TABLET | Freq: Every day | ORAL | Status: DC
  Administered 2017-01-28 – 2017-01-29 (×2): 40 mg via ORAL
  Filled 2017-01-28 (×2): qty 1

## 2017-01-28 MED ORDER — LIDOCAINE-EPINEPHRINE 2 %-1:100000 IJ SOLN
INTRAMUSCULAR | Status: AC
  Filled 2017-01-28: qty 1

## 2017-01-28 MED ORDER — ONDANSETRON HCL 4 MG/2ML IJ SOLN: 4.0000 mg | Freq: Four times a day (QID) | INTRAMUSCULAR | Status: DC | PRN

## 2017-01-28 MED ORDER — MIDAZOLAM HCL 2 MG/2ML IJ SOLN
INTRAMUSCULAR | Status: AC
  Filled 2017-01-28: qty 2

## 2017-01-28 MED ORDER — DEXMEDETOMIDINE HCL IN NACL 200 MCG/50ML IV SOLN
INTRAVENOUS | Status: DC | PRN
  Administered 2017-01-28 (×8): 8 ug via INTRAVENOUS

## 2017-01-28 MED ORDER — PHENOL 1.4 % MT LIQD: 1.0000 | OROMUCOSAL | Status: DC | PRN

## 2017-01-28 MED ORDER — LIDOCAINE-EPINEPHRINE 2 %-1:100000 IJ SOLN
INTRAMUSCULAR | Status: DC | PRN
  Administered 2017-01-28: 30 mL via INTRADERMAL
  Administered 2017-01-28: 5 mL via INTRADERMAL

## 2017-01-28 MED ORDER — ZOLPIDEM TARTRATE 5 MG PO TABS: 5.0000 mg | ORAL_TABLET | Freq: Every evening | ORAL | Status: DC | PRN

## 2017-01-28 SURGICAL SUPPLY — 108 items
ADH SKN CLS APL DERMABOND .7 (GAUZE/BANDAGES/DRESSINGS) ×6
BAG DECANTER FOR FLEXI CONT (MISCELLANEOUS) ×2 IMPLANT
BATTALION LLIF ITRADISCAL SHIM (MISCELLANEOUS) ×4
BIT DRILL LONG 3.0X30 (BIT) ×1 IMPLANT
BIT DRILL LONG 3.0X30MM (BIT) ×1
BIT DRILL LONG 3X80 (BIT) IMPLANT
BIT DRILL LONG 3X80MM (BIT)
BIT DRILL LONG 4X80 (BIT) IMPLANT
BIT DRILL LONG 4X80MM (BIT)
BIT DRILL SHORT 3.0X30 (BIT) IMPLANT
BIT DRILL SHORT 3.0X30MM (BIT)
BIT DRILL SHORT 3X80 (BIT) IMPLANT
BIT DRILL SHORT 3X80MM (BIT)
BLADE CLIPPER SURG (BLADE) IMPLANT
BLADE SURG 11 STRL SS (BLADE) ×2 IMPLANT
BUR MATCHSTICK NEURO 3.0 LAGG (BURR) ×2 IMPLANT
BUR PRECISION FLUTE 6.0 (BURR) ×2 IMPLANT
CABLE BATTALION LLIF LIGHT (MISCELLANEOUS) ×2 IMPLANT
CAGE POROUS 10X9X30 5D (Cage) ×1 IMPLANT
CAGE POROUS 10X9X30MM 5D (Cage) ×1 IMPLANT
CARTRIDGE OIL MAESTRO DRILL (MISCELLANEOUS) ×2 IMPLANT
CHLORAPREP W/TINT 26ML (MISCELLANEOUS) ×6 IMPLANT
DECANTER SPIKE VIAL GLASS SM (MISCELLANEOUS) ×2 IMPLANT
DERMABOND ADVANCED (GAUZE/BANDAGES/DRESSINGS) ×6
DERMABOND ADVANCED .7 DNX12 (GAUZE/BANDAGES/DRESSINGS) ×4 IMPLANT
DIFFUSER DRILL AIR PNEUMATIC (MISCELLANEOUS) ×4 IMPLANT
DILATOR INSULATED XL 8X13 (MISCELLANEOUS) ×2 IMPLANT
DISSECTOR BLUNT TIP ENDO 5MM (MISCELLANEOUS) IMPLANT
DRAPE C-ARM 42X72 X-RAY (DRAPES) ×8 IMPLANT
DRAPE C-ARMOR (DRAPES) ×6 IMPLANT
DRAPE LAPAROTOMY 100X72X124 (DRAPES) ×4 IMPLANT
DRAPE POUCH INSTRU U-SHP 10X18 (DRAPES) ×6 IMPLANT
DRAPE SHEET LG 3/4 BI-LAMINATE (DRAPES) ×4 IMPLANT
DRSG OPSITE 4X5.5 SM (GAUZE/BANDAGES/DRESSINGS) ×2 IMPLANT
DRSG OPSITE POSTOP 3X4 (GAUZE/BANDAGES/DRESSINGS) ×4 IMPLANT
DRSG OPSITE POSTOP 4X10 (GAUZE/BANDAGES/DRESSINGS) ×2 IMPLANT
ELECT BLADE 4.0 EZ CLEAN MEGAD (MISCELLANEOUS)
ELECT COATED BLADE 2.86 ST (ELECTRODE) ×4 IMPLANT
ELECT REM PT RETURN 9FT ADLT (ELECTROSURGICAL) ×4
ELECTRODE BLDE 4.0 EZ CLN MEGD (MISCELLANEOUS) IMPLANT
ELECTRODE REM PT RTRN 9FT ADLT (ELECTROSURGICAL) ×2 IMPLANT
EVACUATOR 1/8 PVC DRAIN (DRAIN) ×2 IMPLANT
GAUZE SPONGE 4X4 16PLY XRAY LF (GAUZE/BANDAGES/DRESSINGS) ×2 IMPLANT
GLOVE BIO SURGEON STRL SZ7 (GLOVE) IMPLANT
GLOVE BIOGEL PI IND STRL 7.0 (GLOVE) IMPLANT
GLOVE BIOGEL PI IND STRL 7.5 (GLOVE) ×2 IMPLANT
GLOVE BIOGEL PI INDICATOR 7.0 (GLOVE)
GLOVE BIOGEL PI INDICATOR 7.5 (GLOVE) ×4
GLOVE ECLIPSE 7.5 STRL STRAW (GLOVE) ×2 IMPLANT
GLOVE SS BIOGEL STRL SZ 7.5 (GLOVE) ×2 IMPLANT
GLOVE SUPERSENSE BIOGEL SZ 7.5 (GLOVE) ×2
GOWN STRL REUS W/ TWL LRG LVL3 (GOWN DISPOSABLE) ×2 IMPLANT
GOWN STRL REUS W/ TWL XL LVL3 (GOWN DISPOSABLE) ×2 IMPLANT
GOWN STRL REUS W/TWL 2XL LVL3 (GOWN DISPOSABLE) IMPLANT
GOWN STRL REUS W/TWL LRG LVL3 (GOWN DISPOSABLE) ×4
GOWN STRL REUS W/TWL XL LVL3 (GOWN DISPOSABLE) ×4
GUIDEWIRE 320MM BLUNT TIP (WIRE) ×4 IMPLANT
HEMOSTAT POWDER KIT SURGIFOAM (HEMOSTASIS) ×2 IMPLANT
KIT BASIN OR (CUSTOM PROCEDURE TRAY) ×6 IMPLANT
KIT INFUSE MEDIUM (Orthopedic Implant) ×2 IMPLANT
KIT INFUSE X SMALL 1.4CC (Orthopedic Implant) ×2 IMPLANT
KIT ROOM TURNOVER OR (KITS) ×4 IMPLANT
KIT SPINE MAZOR X ROBO DISP (MISCELLANEOUS) ×6 IMPLANT
KNIFE ANNULOTOMY (BLADE) ×2 IMPLANT
LEAD WIRE MULTI STAGE DISP (NEUROSURGERY SUPPLIES) ×2 IMPLANT
MILL MEDIUM DISP (BLADE) ×2 IMPLANT
NDL HYPO 21X1.5 SAFETY (NEEDLE) ×2 IMPLANT
NDL HYPO 25X1 1.5 SAFETY (NEEDLE) ×2 IMPLANT
NEEDLE HYPO 21X1.5 SAFETY (NEEDLE) ×28 IMPLANT
NEEDLE HYPO 25X1 1.5 SAFETY (NEEDLE) ×4 IMPLANT
NS IRRIG 1000ML POUR BTL (IV SOLUTION) ×4 IMPLANT
OIL CARTRIDGE MAESTRO DRILL (MISCELLANEOUS) ×4
PACK LAMINECTOMY NEURO (CUSTOM PROCEDURE TRAY) ×6 IMPLANT
PACK UNIVERSAL I (CUSTOM PROCEDURE TRAY) ×6 IMPLANT
PIN HEAD 2.5X60MM (PIN) IMPLANT
PROBE BALL TIP STIM 200MM 2.3M (NEUROSURGERY SUPPLIES) ×2 IMPLANT
PUTTY DBM 10CC ×4 IMPLANT
ROD PRE BENT 100MM (Rod) ×4 IMPLANT
SCREW KODIAK 6.5X50MM (Screw) ×12 IMPLANT
SCREW KODIAK 7.5X45MM (Screw) ×4 IMPLANT
SCREW SCHANZ SA 4.0MM (MISCELLANEOUS) IMPLANT
SEALER BIPOLAR AQUA 2.3 (INSTRUMENTS) ×2 IMPLANT
SET SCREW (Screw) ×32 IMPLANT
SET SCREW SPNE (Screw) IMPLANT
SHIM ITRADISCAL BATTALION LLIF (MISCELLANEOUS) IMPLANT
SPACER BATTALION 22X10X55M 10 (Spacer) ×2 IMPLANT
SPACER LORD BATT 10D 22X10X50 (Spacer) ×2 IMPLANT
SPONGE LAP 4X18 X RAY DECT (DISPOSABLE) IMPLANT
STAPLER VISISTAT 35W (STAPLE) ×4 IMPLANT
SUT MON AB 4-0 PC3 18 (SUTURE) ×2 IMPLANT
SUT STRATAFIX MNCRL+ 3-0 PS-2 (SUTURE) ×2
SUT STRATAFIX MONOCRYL 3-0 (SUTURE) ×2
SUT VIC AB 0 CT1 18XCR BRD8 (SUTURE) IMPLANT
SUT VIC AB 0 CT1 8-18 (SUTURE) ×12
SUT VIC AB 1 CT1 18XBRD ANBCTR (SUTURE) ×2 IMPLANT
SUT VIC AB 1 CT1 8-18 (SUTURE)
SUT VIC AB 2-0 CT1 18 (SUTURE) ×12 IMPLANT
SUT VIC AB 3-0 SH 8-18 (SUTURE) ×2 IMPLANT
SUTURE STRATFX MNCRL+ 3-0 PS-2 (SUTURE) IMPLANT
SYR 30ML LL (SYRINGE) ×8 IMPLANT
SYR 3ML LL SCALE MARK (SYRINGE) ×4 IMPLANT
SYR CONTROL 10ML LL (SYRINGE) ×6 IMPLANT
TIP TROCAR NITINOL ILLICO 20 (INSTRUMENTS) ×18 IMPLANT
TOWEL GREEN STERILE (TOWEL DISPOSABLE) ×4 IMPLANT
TOWEL GREEN STERILE FF (TOWEL DISPOSABLE) ×4 IMPLANT
TRAY FOLEY W/METER SILVER 16FR (SET/KITS/TRAYS/PACK) ×4 IMPLANT
TUBE MAZOR SA REDUCTION (TUBING) ×2 IMPLANT
WATER STERILE IRR 1000ML POUR (IV SOLUTION) ×4 IMPLANT

## 2017-01-28 NOTE — Anesthesia Procedure Notes (Signed)
Arterial Line Insertion Start/End11/19/2018 7:10 AM, 01/28/2017 7:13 AM Performed by: Burt Ekurner, Marine Lezotte Ashley, CRNA, CRNA  Patient location: Pre-op. Preanesthetic checklist: patient identified, IV checked, site marked, risks and benefits discussed, surgical consent, monitors and equipment checked, pre-op evaluation, timeout performed and anesthesia consent Lidocaine 1% used for infiltration Left, radial was placed Catheter size: 20 G Hand hygiene performed  and maximum sterile barriers used   Attempts: 1 Procedure performed without using ultrasound guided technique. Following insertion, Biopatch. Post procedure assessment: normal  Patient tolerated the procedure well with no immediate complications.

## 2017-01-28 NOTE — Transfer of Care (Signed)
Immediate Anesthesia Transfer of Care Note  Patient: Adam Burnett  Procedure(s) Performed: Lumbar Three- Four Lumbar Four- Five Anterior interbody fusion via transpsoas approach (N/A ) APPLICATION OF ROBOTIC ASSISTANCE FOR SPINAL PROCEDURE (N/A ) Lumbar Three-Sacral One Laminectomy for decompression with posterior column osteotomies, Lumbar five-Sacral One Transforaminal lumbar interbody fusion/Lumbar Three-Sacral One Posterior segmental instrumented fusion  Patient Location: PACU  Anesthesia Type:General  Level of Consciousness: alert , oriented and patient cooperative  Airway & Oxygen Therapy: Patient Spontanous Breathing and Patient connected to nasal cannula oxygen  Post-op Assessment: Report given to RN, Post -op Vital signs reviewed and stable and Patient moving all extremities X 4  Post vital signs: Reviewed and stable  Last Vitals:  Vitals:   01/28/17 0559  BP: 137/87  Pulse: 86  Resp: 18  Temp: 36.9 C  SpO2: 96%    Last Pain:  Vitals:   01/28/17 0604  TempSrc:   PainSc: 4          Complications: No apparent anesthesia complications

## 2017-01-28 NOTE — Anesthesia Procedure Notes (Deleted)
Procedure Name: Intubation Date/Time: 01/28/2017 8:00 AM Performed by: Julieta Bellini, CRNA Pre-anesthesia Checklist: Patient identified, Emergency Drugs available, Suction available and Patient being monitored Patient Re-evaluated:Patient Re-evaluated prior to induction Oxygen Delivery Method: Circle system utilized Preoxygenation: Pre-oxygenation with 100% oxygen Induction Type: IV induction Ventilation: Mask ventilation without difficulty and Oral airway inserted - appropriate to patient size Laryngoscope Size: Miller and 3 Grade View: Grade I Tube type: Oral Tube size: 7.5 mm Number of attempts: 1 Airway Equipment and Method: Stylet Placement Confirmation: ETT inserted through vocal cords under direct vision,  positive ETCO2 and breath sounds checked- equal and bilateral Secured at: 23 cm Tube secured with: Tape Dental Injury: Teeth and Oropharynx as per pre-operative assessment  Comments: CRNA attempted with Mac 4 Grade 4 view. Dr. Fransisco Beau attempted with Sabra Heck 3, Grade 1 view, successful intubation

## 2017-01-28 NOTE — Anesthesia Postprocedure Evaluation (Signed)
Anesthesia Post Note  Patient: Adam KarvonenDonald C Burnett  Procedure(s) Performed: Lumbar Three- Four Lumbar Four- Five Anterior interbody fusion via transpsoas approach (N/A ) APPLICATION OF ROBOTIC ASSISTANCE FOR SPINAL PROCEDURE (N/A ) Lumbar Three-Sacral One Laminectomy for decompression with posterior column osteotomies, Lumbar five-Sacral One Transforaminal lumbar interbody fusion/Lumbar Three-Sacral One Posterior segmental instrumented fusion     Patient location during evaluation: PACU Anesthesia Type: General Level of consciousness: awake, awake and alert and oriented Pain management: pain level controlled Vital Signs Assessment: post-procedure vital signs reviewed and stable Respiratory status: spontaneous breathing, nonlabored ventilation and respiratory function stable Cardiovascular status: blood pressure returned to baseline Postop Assessment: no headache Anesthetic complications: no    Last Vitals:  Vitals:   01/28/17 1712 01/28/17 1715  BP: 137/90   Pulse: 88   Resp: 17   Temp:  36.6 C  SpO2: 95%     Last Pain:  Vitals:   01/28/17 1600  TempSrc:   PainSc: 7       LLE Sensation: Full sensation (01/28/17 1715)   RLE Sensation: Full sensation (01/28/17 1715)      Manolo Bosket COKER

## 2017-01-28 NOTE — Progress Notes (Signed)
Pharmacy Antibiotic Note  Adam Burnett is a 56 y.o. male admitted on 01/28/2017 with leg and back pain, now s/p spinal surgery.  Pharmacy has been consulted for vancomycin dosing for surgical prophylaxis in patient with a drain.  Baseline labs reviewed.  Noted patient received vancomycin 1gm IV at 0800 today.  Plan: Vanc 1gm IV Q8H for goal trough 10-15 mcg/mL Monitor renal fxn, clinical progress, vanc trough at Css   Height: 6\' 2"  (188 cm) Weight: 202 lb 13.2 oz (92 kg) IBW/kg (Calculated) : 82.2  Temp (24hrs), Avg:98.1 F (36.7 C), Min:97.9 F (36.6 C), Max:98.4 F (36.9 C)  Recent Labs  Lab 01/28/17 0603  WBC 10.4  CREATININE 0.86    Estimated Creatinine Clearance: 111.5 mL/min (by C-G formula based on SCr of 0.86 mg/dL).    Allergies  Allergen Reactions  . Penicillins Hives, Shortness Of Breath and Rash    PATIENT HAS HAD A PCN REACTION WITH IMMEDIATE RASH, FACIAL/TONGUE/THROAT SWELLING, SOB, OR LIGHTHEADEDNESS WITH HYPOTENSION:  #  #  #  YES  #  #  #   Has patient had a PCN reaction causing severe rash involving mucus membranes or skin necrosis: No PATIENT HAS HAD A PCN REACTION THAT REQUIRED HOSPITALIZATION:  #  #  #  YES  #  #  #   Has patient had a PCN reaction occurring within the last 10 years: No If all of the above answers are "NO", then may proceed with Cephalosporin use.     Adam Burnett, PharmD, BCPS Pager:  [phone] 01/28/2017, 6:17 PM

## 2017-01-28 NOTE — Progress Notes (Signed)
Patient arrived to unit via PACU stretcher and staff. Belongings with wife at bedside.  A&Ox4, vitals stable, questions answered, oriented to unit. Continue to monitor patient.

## 2017-01-28 NOTE — Anesthesia Preprocedure Evaluation (Signed)
Anesthesia Evaluation  Patient identified by MRN, date of birth, ID band Patient awake    Reviewed: Allergy & Precautions, NPO status , Patient's Chart, lab work & pertinent test results  Airway Mallampati: II  TM Distance: >3 FB Neck ROM: Full    Dental  (+) Teeth Intact, Dental Advisory Given   Pulmonary Current Smoker,    breath sounds clear to auscultation       Cardiovascular  Rhythm:Regular Rate:Normal     Neuro/Psych    GI/Hepatic   Endo/Other    Renal/GU      Musculoskeletal   Abdominal   Peds  Hematology   Anesthesia Other Findings   Reproductive/Obstetrics                             Anesthesia Physical Anesthesia Plan  ASA: II  Anesthesia Plan: General   Post-op Pain Management:    Induction: Intravenous  PONV Risk Score and Plan: Ondansetron and Dexamethasone  Airway Management Planned: Oral ETT  Additional Equipment:   Intra-op Plan:   Post-operative Plan: Extubation in OR  Informed Consent: I have reviewed the patients History and Physical, chart, labs and discussed the procedure including the risks, benefits and alternatives for the proposed anesthesia with the patient or authorized representative who has indicated his/her understanding and acceptance.     Dental advisory given  Plan Discussed with: CRNA and Anesthesiologist  Anesthesia Plan Comments:         Anesthesia Quick Evaluation  

## 2017-01-28 NOTE — Brief Op Note (Signed)
01/28/2017  2:53 PM  PATIENT:  Adam Burnett  56 y.o. male  PRE-OPERATIVE DIAGNOSIS:  Lumbosacral spondylosis with radiculopathy  POST-OPERATIVE DIAGNOSIS:  Lumbosacral spondylosis with radiculopathy  PROCEDURE:  Procedure(s) with comments: Lumbar Three- Four Lumbar Four- Five Anterior interbody fusion via transpsoas approach (N/A) - L3-4 L4-5 Anterior interbody fusion via transpsoas approach/L3-S1 Laminectomy for decompression with posterior column osteotomies/L5-S1 Transforaminal lumbar interbody fusion/L3-S1 Posterior segmental instrumented fusion APPLICATION OF ROBOTIC ASSISTANCE FOR SPINAL PROCEDURE (N/A) Lumbar Three-Sacral One Laminectomy for decompression with posterior column osteotomies, Lumbar five-Sacral One Transforaminal lumbar interbody fusion/Lumbar Three-Sacral One Posterior segmental instrumented fusion  SURGEON:  Surgeon(s) and Role:    * Jackeline Gutknecht, Loura HaltBenjamin Jared, MD - Primary  PHYSICIAN ASSISTANT:   ASSISTANTS: Julio SicksHenry Pool, MD  ANESTHESIA:   general  EBL:  800 mL   BLOOD ADMINISTERED:none  DRAINS: Medium hemovac   LOCAL MEDICATIONS USED:  MARCAINE    and LIDOCAINE   SPECIMEN:  No Specimen  DISPOSITION OF SPECIMEN:  N/A  COUNTS:  YES  TOURNIQUET:  * No tourniquets in log *  DICTATION: .Dragon Dictation  PLAN OF CARE: Admit to inpatient   PATIENT DISPOSITION:  PACU - hemodynamically stable.   Delay start of Pharmacological VTE agent (>24hrs) due to surgical blood loss or risk of bleeding: yes

## 2017-01-28 NOTE — H&P (Signed)
CC:  No chief complaint on file. Back and leg  HPI: Adam Burnett is a 56 y.o. male with back and leg pain, left greater than right.  He has lumbosacral spondylosis and stenosis and presents for elective decompression, fusion, and osteotomies for improvement of sagittal balance.  He denies any changes since I saw him last in clinic.  PMH: Past Medical History:  Diagnosis Date  . Anxiety   . Depression   . Hemorrhoids   . Rectal pain     PSH: Past Surgical History:  Procedure Laterality Date  . ANTERIOR CRUCIATE LIGAMENT REPAIR Right   . BACK SURGERY  2007  . COLONOSCOPY    . HAND TENDON SURGERY Right   . HEMORRHOID SURGERY  2005 - approximate  . SPHINCTEROTOMY  2007    SH: Social History   Tobacco Use  . Smoking status: Current Every Day Smoker    Packs/day: 1.00    Types: Cigarettes  . Smokeless tobacco: Never Used  Substance Use Topics  . Alcohol use: No  . Drug use: No    MEDS: Prior to Admission medications   Medication Sig Start Date End Date Taking? Authorizing Provider  HYDROcodone-acetaminophen (NORCO) 10-325 MG per tablet Take 1 tablet every 6 (six) hours as needed by mouth for moderate pain.    Yes [provider]  venlafaxine XR (EFFEXOR-XR) 75 MG 24 hr capsule Take 75 mg daily with breakfast by mouth.   Yes [provider]    ALLERGY: Allergies  Allergen Reactions  . Penicillins Hives, Shortness Of Breath and Rash    PATIENT HAS HAD A PCN REACTION WITH IMMEDIATE RASH, FACIAL/TONGUE/THROAT SWELLING, SOB, OR LIGHTHEADEDNESS WITH HYPOTENSION:  #  #  #  YES  #  #  #   Has patient had a PCN reaction causing severe rash involving mucus membranes or skin necrosis: No PATIENT HAS HAD A PCN REACTION THAT REQUIRED HOSPITALIZATION:  #  #  #  YES  #  #  #   Has patient had a PCN reaction occurring within the last 10 years: No If all of the above answers are "NO", then may proceed with Cephalosporin use.     ROS: ROS  NEUROLOGIC  EXAM: Awake, alert, oriented Memory and concentration grossly intact Speech fluent, appropriate CN grossly intact Motor exam: Upper Extremities Deltoid Bicep Tricep Grip  Right 5/5 5/5 5/5 5/5  Left 5/5 5/5 5/5 5/5   Lower Extremity IP Quad PF DF EHL  Right 5/5 5/5 5/5 5/5 5/5  Left 5/5 5/5 5/5 5/5 5/5   Sensation grossly intact to LT  IMAGING: No new imaging  IMPRESSION: - 56 y.o. male with lumbosacral spondylosis, radiculopathy, and sagittal plane imbalance.  He is neurologically intact.  PLAN: - L3-5 anterior interbody fusion via transpsoas approach; L5-S1 transforaminal interbody fusion; L3-S1 laminectomies for decompression, posterior column osteotomies, and posterior segmental instrumented fusion - We have reviewed the risks, benefits, and alternatives to surgery and he wishes to proceed

## 2017-01-28 NOTE — Anesthesia Procedure Notes (Signed)
Procedure Name: Intubation Date/Time: 01/28/2017 7:58 AM Performed by: Audry Pili, MD Pre-anesthesia Checklist: Patient identified Patient Re-evaluated:Patient Re-evaluated prior to induction Oxygen Delivery Method: Circle system utilized Preoxygenation: Pre-oxygenation with 100% oxygen Induction Type: IV induction Ventilation: Mask ventilation without difficulty Laryngoscope Size: Miller and 3 Grade View: Grade I Tube type: Oral Tube size: 7.5 mm Number of attempts: 2 (First attempt by CRNA with Mac 3, grade 3 view) Airway Equipment and Method: Stylet and Oral airway Placement Confirmation: ETT inserted through vocal cords under direct vision,  positive ETCO2 and breath sounds checked- equal and bilateral Secured at: 22 cm Tube secured with: Tape Dental Injury: Teeth and Oropharynx as per pre-operative assessment

## 2017-01-29 LAB — BASIC METABOLIC PANEL
Anion gap: 5 (ref 5–15)
BUN: 12 mg/dL (ref 6–20)
CO2: 26 mmol/L (ref 22–32)
Calcium: 7.8 mg/dL — ABNORMAL LOW (ref 8.9–10.3)
Chloride: 104 mmol/L (ref 101–111)
Creatinine, Ser: 0.95 mg/dL (ref 0.61–1.24)
GFR calc non Af Amer: >60 mL/min (ref >60)
GLUCOSE: 153 mg/dL — AB (ref 65–99)
Potassium: 4.1 mmol/L (ref 3.5–5.1)
Sodium: 135 mmol/L (ref 135–145)

## 2017-01-29 LAB — POCT I-STAT 4, (NA,K, GLUC, HGB,HCT)
GLUCOSE: 134 mg/dL — AB (ref 65–99)
HEMATOCRIT: 36 % — AB (ref 39.0–52.0)
HEMOGLOBIN: 12.2 g/dL — AB (ref 13.0–17.0)
Potassium: 3.8 mmol/L (ref 3.5–5.1)
Sodium: 136 mmol/L (ref 135–145)

## 2017-01-29 LAB — CBC
HEMATOCRIT: 26.9 % — AB (ref 39.0–52.0)
HEMOGLOBIN: 8.8 g/dL — AB (ref 13.0–17.0)
MCH: 31.8 pg (ref 26.0–34.0)
MCHC: 32.7 g/dL (ref 30.0–36.0)
MCV: 97.1 fL (ref 78.0–100.0)
Platelets: 256 10*3/uL (ref 150–400)
RBC: 2.77 MIL/uL — AB (ref 4.22–5.81)
RDW: 15.4 % (ref 11.5–15.5)
WBC: 13.8 10*3/uL — AB (ref 4.0–10.5)

## 2017-01-29 MED FILL — Sodium Chloride IV Soln 0.9%: INTRAVENOUS | Qty: 1000 | Status: AC

## 2017-01-29 MED FILL — Heparin Sodium (Porcine) Inj 1000 Unit/ML: INTRAMUSCULAR | Qty: 30 | Status: AC

## 2017-01-29 NOTE — Evaluation (Signed)
Physical Therapy Evaluation Patient Details Name: Adam KarvonenDonald C Mcclaine MRN: [id number] DOB: 21-Apr-1960 Today's Date: 01/29/2017   History of Present Illness  56 yo admitted for L3-5 ALIF with L5-S1 transforaminal fusion. PMHx: back surgery, depression  Clinical Impression  Pt very pleasant and willing to mobilize. Pt educated for all back precautions with handout provided and discussed. Pt with decreased transfers, gait and function who will benefit from acute therapy to maximize mobility, gait and adherence to precautions to decrease burden of care. Pt encouraged to walk 3x/day with staff and to sit <30 min.     Follow Up Recommendations No PT follow up    Equipment Recommendations  Rolling walker with 5" wheels;3in1 (PT)    Recommendations for Other Services       Precautions / Restrictions Precautions Precautions: Back;Fall Precaution Booklet Issued: Yes (comment) Required Braces or Orthoses: Spinal Brace;Other Brace/Splint Spinal Brace: Lumbar corset;Applied in sitting position      Mobility  Bed Mobility Overal bed mobility: Needs Assistance Bed Mobility: Rolling;Sidelying to Sit Rolling: Supervision Sidelying to sit: Supervision       General bed mobility comments: supervision for cues for safety and precautions  Transfers Overall transfer level: Needs assistance   Transfers: Sit to/from Stand Sit to Stand: Supervision         General transfer comment: cues for hand placement and safety  Ambulation/Gait Ambulation/Gait assistance: Min guard Ambulation Distance (Feet): 150 Feet Assistive device: Rolling walker (2 wheeled) Gait Pattern/deviations: Step-through pattern;Decreased stride length   Gait velocity interpretation: Below normal speed for age/gender General Gait Details: cues for shoulder depression, position in rW and safety. Slow cautious gait but steady with use of RW  Stairs            Wheelchair Mobility    Modified Rankin (Stroke  Patients Only)       Balance Overall balance assessment: History of Falls                                           Pertinent Vitals/Pain Pain Assessment: 0-10 Pain Score: 8  Pain Location: incision and thighs Pain Descriptors / Indicators: Aching Pain Intervention(s): Limited activity within patient's tolerance;Repositioned;Monitored during session;Premedicated before session    Home Living Family/patient expects to be discharged to:: Private residence Living Arrangements: Spouse/significant other Available Help at Discharge: Family;Available 24 hours/day Type of Home: House Home Access: Stairs to enter Entrance Stairs-Rails: Right;Left;Can reach both Entrance Stairs-Number of Steps: 4 Home Layout: One level Home Equipment: Cane - single point;Shower seat - built in      Prior Function Level of Independence: Independent               Higher education careers adviserHand Dominance        Extremity/Trunk Assessment   Upper Extremity Assessment Upper Extremity Assessment: Overall WFL for tasks assessed    Lower Extremity Assessment Lower Extremity Assessment: Generalized weakness    Cervical / Trunk Assessment Cervical / Trunk Assessment: Other exceptions Cervical / Trunk Exceptions: post surgical with brace and guarding  Communication   Communication: No difficulties  Cognition Arousal/Alertness: Awake/alert Behavior During Therapy: WFL for tasks assessed/performed Overall Cognitive Status: Within Functional Limits for tasks assessed  General Comments      Exercises     Assessment/Plan    PT Assessment Patient needs continued PT services  PT Problem List Decreased strength;Decreased mobility;Decreased safety awareness;Decreased activity tolerance;Decreased balance;Decreased knowledge of use of DME;Pain       PT Treatment Interventions Gait training;Therapeutic exercise;Patient/family education;Stair  training;Functional mobility training;DME instruction;Therapeutic activities    PT Goals (Current goals can be found in the Care Plan section)  Acute Rehab PT Goals Patient Stated Goal: return to work and fishing PT Goal Formulation: With patient Time For Goal Achievement: 02/12/17 Potential to Achieve Goals: Good    Frequency Min 5X/week   Barriers to discharge        Co-evaluation               AM-PAC PT "6 Clicks" Daily Activity  Outcome Measure Difficulty turning over in bed (including adjusting bedclothes, sheets and blankets)?: A Little Difficulty moving from lying on back to sitting on the side of the bed? : A Little Difficulty sitting down on and standing up from a chair with arms (e.g., wheelchair, bedside commode, etc,.)?: A Little Help needed moving to and from a bed to chair (including a wheelchair)?: A Little Help needed walking in hospital room?: A Little Help needed climbing 3-5 steps with a railing? : A Little 6 Click Score: 18    End of Session Equipment Utilized During Treatment: Gait belt;Back brace Activity Tolerance: Patient tolerated treatment well Patient left: in chair;with call bell/phone within reach;with nursing/sitter in room Nurse Communication: Mobility status;Precautions PT Visit Diagnosis: Muscle weakness (generalized) (M62.81);Other abnormalities of gait and mobility (R26.89)    Time: [id number] PT Time Calculation (min) (ACUTE ONLY): 24 min   Charges:   PT Evaluation $PT Eval Moderate Complexity: 1 Mod PT Treatments $Gait Training: 8-22 mins   PT G Codes:        Delaney MeigsMaija Tabor Zaydn Gutridge, PT [phone]   Pina Sirianni B Cambell Rickenbach 01/29/2017, 10:05 AM

## 2017-01-29 NOTE — Evaluation (Signed)
Occupational Therapy Evaluation Patient Details Name: Adam Burnett MRN: [id number] DOB: [birth year] Today's Date: 01/29/2017    History of Present Illness 56 yo admitted for L3-5 ALIF with L5-S1 transforaminal fusion. PMHx: back surgery, depression   Clinical Impression   PTA, pt was living with his wife and was independent. Currently, pt requires Max A for LB ADLs and Min Guard A for functional mobility using RW. Provided education on toilet transfer with 3N1; pt demonstrated understanding. Pt would benefit form further acute OT to provided education on LB ADLs and AE. Recommend dc home once medically stable per physician.      Follow Up Recommendations  No OT follow up;Supervision/Assistance - 24 hour    Equipment Recommendations  3 in 1 bedside commode    Recommendations for Other Services PT consult     Precautions / Restrictions Precautions Precautions: Back;Fall Precaution Booklet Issued: Yes (comment) Precaution Comments: Pt able to recall 2/3 precautions with Min VCs for bending Required Braces or Orthoses: Spinal Brace;Other Brace/Splint Spinal Brace: Lumbar corset;Applied in sitting position Restrictions Weight Bearing Restrictions: No      Mobility Bed Mobility Overal bed mobility: Needs Assistance Bed Mobility: Rolling;Sidelying to Sit Rolling: Supervision Sidelying to sit: Supervision       General bed mobility comments: supervision for cues for safety and precautions. Demonstrating good technique. HOB lowered  Transfers Overall transfer level: Needs assistance Equipment used: Rolling walker (2 wheeled) Transfers: Sit to/from Stand Sit to Stand: Supervision         General transfer comment: cues for hand placement and safety    Balance Overall balance assessment: History of Falls                                         ADL either performed or assessed with clinical judgement   ADL Overall ADL's : Needs  assistance/impaired Eating/Feeding: Set up;Sitting   Grooming: Min guard;Standing   Upper Body Bathing: Set up;Supervision/ safety;Sitting   Lower Body Bathing: Maximal assistance;Sit to/from stand   Upper Body Dressing : Set up;Supervision/safety;Sitting Upper Body Dressing Details (indicate cue type and reason): Donned back brace and gown like jacket at EOB with supervision. Lower Body Dressing: Maximal assistance;Sit to/from stand Lower Body Dressing Details (indicate cue type and reason): Unable to bring ankel to knee. Will need further education on AE.   Toilet Transfer: Min Social workerguard;BSC;Ambulation;RW Toilet Transfer Details (indicate cue type and reason): Pt demonstrating safe technique. Educated on [facility] use over toilet at home.          Functional mobility during ADLs: Min guard;Rolling walker General ADL Comments: Pt performing ADLs and funcitonal mobility at supervision-Min guard level with exception of LB ADLs. Pt requiring Max A for LB ADLs and will need further education on AE.     Vision Baseline Vision/History: Wears glasses Wears Glasses: At all times Patient Visual Report: No change from baseline       Perception     Praxis      Pertinent Vitals/Pain Pain Assessment: 0-10 Pain Score: 7  Pain Location: incision and thighs Pain Descriptors / Indicators: Aching Pain Intervention(s): Monitored during session;Limited activity within patient's tolerance;Repositioned     Hand Dominance Right   Extremity/Trunk Assessment Upper Extremity Assessment Upper Extremity Assessment: Overall WFL for tasks assessed   Lower Extremity Assessment Lower Extremity Assessment: Generalized weakness   Cervical / Trunk Assessment Cervical / Trunk Assessment:  Other exceptions Cervical / Trunk Exceptions: post surgical with brace and guarding   Communication Communication Communication: No difficulties   Cognition Arousal/Alertness: Awake/alert Behavior During Therapy: WFL  for tasks assessed/performed Overall Cognitive Status: Within Functional Limits for tasks assessed                                     General Comments       Exercises     Shoulder Instructions      Home Living Family/patient expects to be discharged to:: Private residence Living Arrangements: Spouse/significant other Available Help at Discharge: Family;Available 24 hours/day Type of Home: House Home Access: Stairs to enter Entergy CorporationEntrance Stairs-Number of Steps: 4 Entrance Stairs-Rails: Right;Left;Can reach both Home Layout: One level     Bathroom Shower/Tub: Producer, television/film/videoWalk-in shower   Bathroom Toilet: Standard     Home Equipment: Cane - single point;Shower seat - built Designer, fashion/clothingin;Adaptive equipment Adaptive Equipment: Reacher        Prior Functioning/Environment Level of Independence: Independent        Comments: ADLs, IADLs, driving, works full time at [employer]. Enjoys golfing and fishing        OT Problem List: Decreased range of motion;Decreased strength;Impaired balance (sitting and/or standing);Decreased safety awareness;Decreased knowledge of use of DME or AE;Decreased knowledge of precautions;Pain      OT Treatment/Interventions: Self-care/ADL training;Therapeutic exercise;Energy conservation;DME and/or AE instruction;Therapeutic activities;Patient/family education    OT Goals(Current goals can be found in the care plan section) Acute Rehab OT Goals Patient Stated Goal: return to work and fishing OT Goal Formulation: With patient Time For Goal Achievement: 02/12/17 Potential to Achieve Goals: Good ADL Goals Pt Will Perform Lower Body Bathing: with modified independence;with adaptive equipment;sit to/from stand Pt Will Perform Lower Body Dressing: with modified independence;sit to/from stand;with adaptive equipment Pt Will Perform Tub/Shower Transfer: Shower transfer;with modified independence;rolling walker;shower seat;ambulating Additional ADL Goal #1:  Pt will independently verbalize 3/3 back precautions  OT Frequency: Min 2X/week   Barriers to D/C:            Co-evaluation              AM-PAC PT "6 Clicks" Daily Activity     Outcome Measure Help from another person eating meals?: None Help from another person taking care of personal grooming?: A Little Help from another person toileting, which includes using toliet, bedpan, or urinal?: A Little Help from another person bathing (including washing, rinsing, drying)?: A Lot Help from another person to put on and taking off regular upper body clothing?: None Help from another person to put on and taking off regular lower body clothing?: A Lot 6 Click Score: 18   End of Session Equipment Utilized During Treatment: Gait belt;Rolling walker;Back brace Nurse Communication: Mobility status;Precautions  Activity Tolerance: Patient tolerated treatment well Patient left: in chair;with call bell/phone within reach  OT Visit Diagnosis: Unsteadiness on feet (R26.81);Other abnormalities of gait and mobility (R26.89);History of falling (Z91.81)                Time: 1043-1110 OT Time Calculation (min): 27 min Charges:  OT General Charges $OT Visit: 1 Visit OT Evaluation $OT Eval Moderate Complexity: 1 Mod OT Treatments $Self Care/Home Management : 8-22 mins G-Codes:     Marthann Abshier MSOT, OTR/L Acute Rehab Pager: [phone] Office: [phone]  Theodoro GristCharis M Ellamae Lybeck 01/29/2017, 11:35 AM

## 2017-01-29 NOTE — NC FL2 (Signed)
[place] MEDICAID FL2 LEVEL OF CARE SCREENING TOOL     IDENTIFICATION  Patient Name: Adam KarvonenDonald C Noga Birthdate: May 18, 1960 Sex: male Admission Date (Current Location): 01/28/2017  [facility] [place]   Facility and Address:  [facility], [address], [place], [place] [address]      Provider Number: [id number]  Attending Physician Name and Address:  Ditty, Loura HaltBenjamin Jared, MD  Relative Name and Phone Number:       Current Level of Care: Hospital Recommended Level of Care: Skilled Nursing Facility Prior Approval Number:    Date Approved/Denied:   PASRR Number:   [id number]  Discharge Plan: SNF    Current Diagnoses: Patient Active Problem List   Diagnosis Date Noted  . Lumbosacral spondylosis with radiculopathy 01/28/2017    Orientation RESPIRATION BLADDER Height & Weight     Self, Time, Place  Normal Continent Weight: 211 lb 10.3 oz (96 kg) Height:  6\' 2"  (188 cm)  BEHAVIORAL SYMPTOMS/MOOD NEUROLOGICAL BOWEL NUTRITION STATUS      Continent Diet(please see discharge summary. )  AMBULATORY STATUS COMMUNICATION OF NEEDS Skin   Supervision   Surgical wounds(Flank )                       Personal Care Assistance Level of Assistance  Bathing, Feeding, Dressing Bathing Assistance: Limited assistance Feeding assistance: Limited assistance Dressing Assistance: Limited assistance     Functional Limitations Info  Sight, Hearing, Speech Sight Info: Adequate Hearing Info: Adequate Speech Info: Adequate    SPECIAL CARE FACTORS FREQUENCY  PT (By licensed PT), OT (By licensed OT)     PT Frequency: 5 times a week  OT Frequency: 5 times a week             Contractures Contractures Info: Not present    Additional Factors Info  Code Status, Allergies Code Status Info: Full  Allergies Info: Penicillins           Current Medications (01/29/2017):  This is the current hospital active medication  list Current Facility-Administered Medications  Medication Dose Route Frequency Provider Last Rate Last Dose  . 0.9 %  sodium chloride infusion   Intravenous Continuous Ditty, Loura HaltBenjamin Jared, MD   Stopped at 01/29/17 0900  . 0.9 %  sodium chloride infusion  250 mL Intravenous Continuous Ditty, Loura HaltBenjamin Jared, MD      . acetaminophen (TYLENOL) tablet 1,000 mg  1,000 mg Oral Q6H Ditty, Loura HaltBenjamin Jared, MD   1,000 mg at 01/29/17 0606  . bisacodyl (DULCOLAX) EC tablet 5 mg  5 mg Oral Daily PRN Ditty, Loura HaltBenjamin Jared, MD      . celecoxib (CELEBREX) capsule 200 mg  200 mg Oral Q12H Ditty, Loura HaltBenjamin Jared, MD   200 mg at 01/29/17 [id number]  . diazepam (VALIUM) tablet 5 mg  5 mg Oral Q6H PRN Ditty, Loura HaltBenjamin Jared, MD      . docusate sodium (COLACE) capsule 100 mg  100 mg Oral BID Ditty, Loura HaltBenjamin Jared, MD   100 mg at 01/29/17 [id number]  . gabapentin (NEURONTIN) capsule 300 mg  300 mg Oral TID Ditty, Loura HaltBenjamin Jared, MD   300 mg at 01/29/17 0937  . menthol-cetylpyridinium (CEPACOL) lozenge 3 mg  1 lozenge Oral PRN Ditty, Loura HaltBenjamin Jared, MD       Or  . phenol (CHLORASEPTIC) mouth spray 1 spray  1 spray Mouth/Throat PRN Ditty, Loura HaltBenjamin Jared, MD      . methocarbamol (ROBAXIN) tablet 750  mg  750 mg Oral QID Ditty, Loura HaltBenjamin Jared, MD   750 mg at 01/29/17 [id number]  . ondansetron (ZOFRAN) tablet 4 mg  4 mg Oral Q6H PRN Ditty, Loura HaltBenjamin Jared, MD       Or  . ondansetron [facility](ZOFRAN) injection 4 mg  4 mg Intravenous Q6H PRN Ditty, Loura HaltBenjamin Jared, MD      . oxyCODONE (Oxy IR/ROXICODONE) immediate release tablet 10 mg  10 mg Oral Q3H PRN Ditty, Loura HaltBenjamin Jared, MD   10 mg at 01/29/17 0610  . oxyCODONE (Oxy IR/ROXICODONE) immediate release tablet 5 mg  5 mg Oral Q3H PRN Ditty, Loura HaltBenjamin Jared, MD      . oxyCODONE (OXYCONTIN) 12 hr tablet 20 mg  20 mg Oral Q12H Ditty, Loura HaltBenjamin Jared, MD   20 mg at 01/29/17 [id number]  . pantoprazole (PROTONIX) EC tablet 40 mg  40 mg Oral Daily Ditty, Loura HaltBenjamin Jared, MD   40 mg at 01/28/17 2236  . senna (SENOKOT)  tablet 8.6 mg  1 tablet Oral BID Ditty, Loura HaltBenjamin Jared, MD   8.6 mg at 01/29/17 [id number]  . sodium chloride flush (NS) 0.9 % injection 3 mL  3 mL Intravenous Q12H Ditty, Loura HaltBenjamin Jared, MD   3 mL at 01/29/17 0938  . sodium chloride flush (NS) 0.9 % injection 3 mL  3 mL Intravenous PRN Ditty, Loura HaltBenjamin Jared, MD      . sodium phosphate (FLEET) 7-19 GM/118ML enema 1 enema  1 enema Rectal Once PRN Ditty, Loura HaltBenjamin Jared, MD      . vancomycin (VANCOCIN) IVPB 1000 mg/200 mL premix  1,000 mg Intravenous Q8H Gerilyn NestleDang, Thuy D, [facility]   Stopped at 01/29/17 0434  . venlafaxine XR (EFFEXOR-XR) 24 hr capsule 75 mg  75 mg Oral Q breakfast Ditty, Loura HaltBenjamin Jared, MD   75 mg at 01/29/17 0829  . zolpidem (AMBIEN) tablet 5 mg  5 mg Oral QHS PRN,MR X 1 Ditty, Loura HaltBenjamin Jared, MD         Discharge Medications: Please see discharge summary for a list of discharge medications.  Relevant Imaging Results:  Relevant Lab Results:   Additional Information [id number]  Robb MatarKierra S Mekiah Wahler, LCSWA

## 2017-01-29 NOTE — Progress Notes (Signed)
Pt seen and examined. No issues overnight.  EXAM: Temp:  [97.9 F (36.6 C)-99.4 F (37.4 C)] 99.1 F (37.3 C) (11/20 0821) Pulse Rate:  [76-103] 90 (11/20 0821) Resp:  [12-18] 18 (11/20 0821) BP: (122-157)/(73-96) 157/73 (11/20 0821) SpO2:  [91 %-97 %] 96 % (11/20 0821) Arterial Line BP: (131-171)/(66-76) 171/76 (11/19 1642) Weight:  [96 kg (211 lb 10.3 oz)] 96 kg (211 lb 10.3 oz) (11/19 1810) Intake/Output      11/19 0701 - 11/20 0700 11/20 0701 - 11/21 0700   I.V. (mL/kg) 3561.7 (37.1) 500 (5.2)   Blood 300    IV Piggyback 900    Total Intake(mL/kg) 4761.7 (49.6) 500 (5.2)   Urine (mL/kg/hr) 2030 (0.9) 225 (0.5)   Drains 480 70   Blood 800    Total Output 3310 295   Net +1451.7 +205         Awake and alert Follows commands throughout Full strength  Stable Continue to encourage ambulation Possibly home tomorrow

## 2017-01-29 NOTE — Op Note (Signed)
01/28/2017  10:27 PM  PATIENT:  Adam Burnett  56 y.o. male  PRE-OPERATIVE DIAGNOSIS:  Lumbosacral spondylosis with radiculopathy; lumbar stenosis L3-S1; sagittal plane imbalance  POST-OPERATIVE DIAGNOSIS:  Same  PROCEDURE:  L3-4, L4-5 anterior interbody fusion via transpsoas approach; L5-S1 transforaminal lumbar interbody fusion, re-operative left L4-5 and left L5-S1 laminectomies for decompression, L3-S1 laminectomies for decompression, L3-S1 posterior column osteotomies, L3-S1 posterior segmental instrumented fusion, use of BMP  SURGEON:  Hulan SaasBenjamin J. Ditty, MD  ASSISTANTS: Julio SicksHenry Pool, MD  ANESTHESIA:   General  DRAINS: Medium hemovac   SPECIMEN:  None  INDICATION FOR PROCEDURE: 56 year old male with intractable back and leg pain.  He did not improve with conservative treatment.  I recommended the above procedure.  Patient understood the risks, benefits, and alternatives and potential outcomes and wished to proceed.  PROCEDURE DETAILS: The patient was brought to the operating room.  After smooth induction of general endotracheal anesthesia the patient was placed in the lateral position with the right side up and secured with tape.  Localizing views were taken with fluoroscopy.  The operative site was then prepped and draped in the usual sterile fashion.  The planned incisions were infiltrated with lidocaine with epinephrine.   The skin was sharply incised and soft tissue to the level of the oblique abdominal muscles was carried out with monopolar cautery.  I then bluntly dissected through the oblique muscles with care taken to preserve any nerves.  I encountered encountered transversalis fascia which was bluntly entered and this opened into an easily dissected fat plane consistent with the retroperitoneum.  I inserted the dilator and approached the posterior third of the L4-5 disc space.  I confirmed that I was not in contact with any nerves of the lumbar plexus. A K-wire was  passed into the disc space.  The tract was sequentially dilated and then the retractor was introduced.  I incised the disc space and then passed a Cobb elevator along each endplate and penetrated the annulus on the contralateral side.  I then used box cutters, a paddle, and a rasp to complete the discectomy.  A trial spacer was used to determine appropriate graft size and then a PEEK lordotic interbody graft packed with BMP and alphagraft was inserted.  The retractor was removed after hemostasis was confirmed.   The retractor was then repositioned to L3-4.  Discectomy was again performed.  A second interbody graft packed with BMP and alphagraft was inserted.  Hemostasis was again confirmed and the retractor was removed.     The incisions were closed in layers with interrupted vicryl sutures.  The skin was sealed with dermabond.  The patient was turned prone on the open [place] table. The skin of the lumbar area was clipped of hair and wiped out with alcohol. It was prepped and draped in the usual sterile fashion. The planned incision was injected with lidocaine with epinephrine.  The skin was opened sharply and a subperiosteal dissection was performed to expose the lateral edges of the lamina of L3-S1 as well as the sacral ala with care taken to avoid the laminotomies on the left at L4-5 and L5-S1.  Subperiosteal dissection was performed over the L3-4, L4-5 and L5-S1 facet joints bilaterally.    The [facility] robotic system was registered to the patient using fluoroscopy.  Using the [facility] as a drill guide cortical trajectory screw tracts were then drilled at L3, L4, L5, and S1 bilaterally.  K-Wires were placed down the trajectories and I  then tapped over the K-wires.  K-wires were removed and the trajectories were palpated with a ball tip probe and found to be competent.  I then resected the spinous processes of L3, L4, L5, and superior S1.  I used the high speed drill to drill a trough across the lamina  and pars at L3, L4, and L5.  I then used an osteotome to fracture the bone across the pars and separate the inferior articular process at each level.  This was separated from the underlying dura and ligament and removed from the surgical field.  The superior aspect of each superior articular process of L4, L5, and S1 was resected with a Kerrison and the foramina were decompressed.  The underlying ligament was elevated and resected.  The laminectomies were completed with Kerrison rongeurs.  At L4-5 and L5-S1 on the left there was dense scar adherent to the bone edge and thecal sac.  This was first separated with curettes prior to completing bony resection.  Decompression was carried out to the lateral recesses with Kerrison rongeurs.  The superior edge of the exposed superior articular processes was resected to expose underlying annulus.  The thecal sac and nerve roots was separated from the posterior vertebral body wall and annulus at L5-S1 on the left.  At left L5-S1 an annulotomy was made. Using sequentially larger disc shavers this space was expanded. Disc material was removed with shavers, curettes, and the bone was decorticated with a rasp. The interbody space was packed with a combination of BMP and locally harvested autograft.  A lordotic porous titanium spacer was inserted into the interspace and rotated into the appropriate position.  Screws were then placed to the appropriate depth down the cannulated tracts.  A lordotic rod was inserted on each side and secured in the screw caps. Final tightening with a torque wrench was performed at all levels. The remaining facet joints and transverse processes from L3 to the sacral ala were decorticated with the high speed burr.  Fusion substrate consisting of locally harvested autograft, DBM putty, and BMP was placed in the lateral gutters.  Meticulous hemostasis was obtained. The wound was irrigated with bacitracin saline. Exparel and marcaine was injected into  the paraspinous muscles.  A medium Hemovac drain was placed below the fascia. About 1 g of vancomycin powder was inserted into the wound. The wound was closed in routine anatomic layers. The skin was closed with a running subcuticular monocryl suture and then sealed with dermabond.   The patient was returned to the supine position and awoke without complication.  PATIENT DISPOSITION:  PACU - hemodynamically stable.   Delay start of Pharmacological VTE agent (>24hrs) due to surgical blood loss or risk of bleeding:  yes

## 2017-01-30 MED ORDER — BACLOFEN 10 MG PO TABS: 10.0000 mg | ORAL_TABLET | Freq: Three times a day (TID) | ORAL | 1 refills | Status: DC

## 2017-01-30 MED ORDER — OXYCODONE-ACETAMINOPHEN 7.5-325 MG PO TABS: 1.0000 | ORAL_TABLET | ORAL | 0 refills | Status: DC | PRN

## 2017-01-30 MED ORDER — BACLOFEN 10 MG PO TABS
10.0000 mg | ORAL_TABLET | Freq: Three times a day (TID) | ORAL | Status: DC
  Administered 2017-01-30: 10 mg via ORAL
  Filled 2017-01-30: qty 1

## 2017-01-30 MED ORDER — BACLOFEN 10 MG PO TABS: 10.0000 mg | ORAL_TABLET | Freq: Three times a day (TID) | ORAL | 0 refills | Status: DC

## 2017-01-30 MED ORDER — GABAPENTIN 300 MG PO CAPS: 300.0000 mg | ORAL_CAPSULE | Freq: Three times a day (TID) | ORAL | 2 refills | Status: DC

## 2017-01-30 NOTE — Progress Notes (Signed)
Doing well Moving legs well D/c home

## 2017-01-30 NOTE — Discharge Summary (Signed)
Physician Discharge Summary  Patient ID: Adam Burnett MRN: [id number] DOB/AGE: 56-Nov-1962 56 y.o.  Admit date: 01/28/2017 Discharge date: 01/30/2017  Admission Diagnoses:  Lumbar radiculopathy  Discharge Diagnoses:  Same Active Problems:   Lumbosacral spondylosis with radiculopathy   Discharged Condition: Stable  Hospital Course:  Adam Burnett is a 56 y.o. male who was admitted for the below procedure. There were no post operative complications. At time of discharge, pain was well controlled, ambulating with Pt/OT, tolerating po, voiding normal. Ready for discharge.  Treatments: Surgery L3-4, L4-5 anterior interbody fusion via transpsoas approach; L5-S1 transforaminal lumbar interbody fusion, re-operative left L4-5 and left L5-S1 laminectomies for decompression, L3-S1 laminectomies for decompression, L3-S1 posterior column osteotomies, L3-S1 posterior segmental instrumented fusion, use of BMP   Discharge Exam: Blood pressure 130/73, pulse 82, temperature 98.5 F (36.9 C), temperature source Oral, resp. rate 18, height 6\' 2"  (1.88 m), weight 96 kg (211 lb 10.3 oz), SpO2 94 %. Awake, alert, oriented Speech fluent, appropriate CN grossly intact 5/5 BUE/BLE Wound c/d/i  Disposition:   Discharge Instructions    Call MD for:  difficulty breathing, headache or visual disturbances   Complete by:  As directed    Call MD for:  persistant dizziness or light-headedness   Complete by:  As directed    Call MD for:  redness, tenderness, or signs of infection (pain, swelling, redness, odor or green/yellow discharge around incision site)   Complete by:  As directed    Call MD for:  severe uncontrolled pain   Complete by:  As directed    Call MD for:  temperature >100.4   Complete by:  As directed    Diet general   Complete by:  As directed    Driving Restrictions   Complete by:  As directed    Do not drive until given clearance.   Increase activity slowly    Complete by:  As directed    Lifting restrictions   Complete by:  As directed    Do not lift anything >10lbs. Avoid bending and twisting in awkward positions. Avoid bending at the back.   May shower / Bathe   Complete by:  As directed    In 24 hours. Okay to wash wound with warm soapy water. Avoid scrubbing the wound. Pat dry.   Remove dressing in 24 hours   Complete by:  As directed      Allergies as of 01/30/2017      Reactions   Penicillins Hives, Shortness Of Breath, Rash   PATIENT HAS HAD A PCN REACTION WITH IMMEDIATE RASH, FACIAL/TONGUE/THROAT SWELLING, SOB, OR LIGHTHEADEDNESS WITH HYPOTENSION:  #  #  #  YES  #  #  #   Has patient had a PCN reaction causing severe rash involving mucus membranes or skin necrosis: No PATIENT HAS HAD A PCN REACTION THAT REQUIRED HOSPITALIZATION:  #  #  #  YES  #  #  #   Has patient had a PCN reaction occurring within the last 10 years: No If all of the above answers are "NO", then may proceed with Cephalosporin use.      Medication List    STOP taking these medications   HYDROcodone-acetaminophen 10-325 MG tablet Commonly known as:  NORCO     TAKE these medications   baclofen 10 MG tablet Commonly known as:  LIORESAL Take 1 tablet (10 mg total) by mouth 3 (three) times daily.   gabapentin 300 MG capsule Commonly known as:  NEURONTIN Take 1 capsule (300 mg total) by mouth 3 (three) times daily.   oxyCODONE-acetaminophen 7.5-325 MG tablet Commonly known as:  PERCOCET Take 1 tablet by mouth every 4 (four) hours as needed for severe pain.   venlafaxine XR 75 MG 24 hr capsule Commonly known as:  EFFEXOR-XR Take 75 mg daily with breakfast by mouth.      Follow-up Information    Ditty, Loura HaltBenjamin Jared, MD Follow up.   Specialty:  Neurosurgery Contact information: [address] [place] [address] [id number]           Signed: Alyson InglesCOSTELLA, Lasaundra Riche J 01/30/2017, 8:19 AM

## 2017-01-30 NOTE — Care Management Note (Signed)
Case Management Note  Patient Details  Name: Norva KarvonenDonald C Barkdull MRN: [id number] Date of Birth: [birth year]  Subjective/Objective:   56 yo admitted for L3-5 ALIF with L5-S1 transforaminal fusion.  PTA, pt independent, lives with spouse.                   Action/Plan: PT/OT recommending no OP follow up, DME.  Pt agreeable to RW and 3 in 1 for home.  [facility]  3 in 1 and RW to be delivered to pt's room prior to dc.   Expected Discharge Date:  01/30/17               Expected Discharge Plan:  Home/Self Care  In-House Referral:     Discharge planning Services  CM Consult  Post Acute Care Choice:    Choice offered to:     DME Arranged:  3-N-1, Walker rolling DME Agency:  [facility]  [facility]    [facility]:     Status of Service:  Completed, signed off  If discussed at [employer] Length of Stay Meetings, dates discussed:    Additional Comments:  Quintella BatonJulie W. Zayvion Stailey, RN, BSN  Trauma/Neuro ICU Case Manager [phone]

## 2017-02-01 ENCOUNTER — Encounter (HOSPITAL_COMMUNITY): Payer: Self-pay | Admitting: Neurological Surgery

## 2017-02-26 DIAGNOSIS — Z981 Arthrodesis status: Secondary | ICD-10-CM | POA: Diagnosis not present

## 2017-03-13 DIAGNOSIS — M545 Low back pain: Secondary | ICD-10-CM | POA: Diagnosis not present

## 2017-03-13 DIAGNOSIS — M4150 Other secondary scoliosis, site unspecified: Secondary | ICD-10-CM | POA: Diagnosis not present

## 2017-03-20 DIAGNOSIS — M4150 Other secondary scoliosis, site unspecified: Secondary | ICD-10-CM | POA: Diagnosis not present

## 2017-03-20 DIAGNOSIS — M545 Low back pain: Secondary | ICD-10-CM | POA: Diagnosis not present

## 2017-03-22 DIAGNOSIS — M4150 Other secondary scoliosis, site unspecified: Secondary | ICD-10-CM | POA: Diagnosis not present

## 2017-03-22 DIAGNOSIS — M545 Low back pain: Secondary | ICD-10-CM | POA: Diagnosis not present

## 2017-03-26 DIAGNOSIS — M4150 Other secondary scoliosis, site unspecified: Secondary | ICD-10-CM | POA: Diagnosis not present

## 2017-03-26 DIAGNOSIS — M545 Low back pain: Secondary | ICD-10-CM | POA: Diagnosis not present

## 2017-03-28 DIAGNOSIS — M545 Low back pain: Secondary | ICD-10-CM | POA: Diagnosis not present

## 2017-03-28 DIAGNOSIS — M4150 Other secondary scoliosis, site unspecified: Secondary | ICD-10-CM | POA: Diagnosis not present

## 2017-03-29 DIAGNOSIS — M4727 Other spondylosis with radiculopathy, lumbosacral region: Secondary | ICD-10-CM | POA: Diagnosis not present

## 2017-04-02 DIAGNOSIS — M545 Low back pain: Secondary | ICD-10-CM | POA: Diagnosis not present

## 2017-04-02 DIAGNOSIS — M4150 Other secondary scoliosis, site unspecified: Secondary | ICD-10-CM | POA: Diagnosis not present

## 2017-04-05 DIAGNOSIS — M4150 Other secondary scoliosis, site unspecified: Secondary | ICD-10-CM | POA: Diagnosis not present

## 2017-04-05 DIAGNOSIS — M545 Low back pain: Secondary | ICD-10-CM | POA: Diagnosis not present

## 2017-04-11 DIAGNOSIS — M4150 Other secondary scoliosis, site unspecified: Secondary | ICD-10-CM | POA: Diagnosis not present

## 2017-04-11 DIAGNOSIS — M545 Low back pain: Secondary | ICD-10-CM | POA: Diagnosis not present

## 2017-04-17 DIAGNOSIS — M545 Low back pain: Secondary | ICD-10-CM | POA: Diagnosis not present

## 2017-04-17 DIAGNOSIS — M4150 Other secondary scoliosis, site unspecified: Secondary | ICD-10-CM | POA: Diagnosis not present

## 2017-04-24 DIAGNOSIS — M1711 Unilateral primary osteoarthritis, right knee: Secondary | ICD-10-CM | POA: Diagnosis not present

## 2017-04-24 DIAGNOSIS — M25561 Pain in right knee: Secondary | ICD-10-CM | POA: Diagnosis not present

## 2017-04-24 DIAGNOSIS — S83511S Sprain of anterior cruciate ligament of right knee, sequela: Secondary | ICD-10-CM | POA: Diagnosis not present

## 2017-05-20 DIAGNOSIS — R29898 Other symptoms and signs involving the musculoskeletal system: Secondary | ICD-10-CM | POA: Diagnosis not present

## 2017-05-20 DIAGNOSIS — F329 Major depressive disorder, single episode, unspecified: Secondary | ICD-10-CM | POA: Diagnosis not present

## 2017-05-20 DIAGNOSIS — Z6825 Body mass index (BMI) 25.0-25.9, adult: Secondary | ICD-10-CM | POA: Diagnosis not present

## 2017-05-20 DIAGNOSIS — Z0000 Encounter for general adult medical examination without abnormal findings: Secondary | ICD-10-CM | POA: Diagnosis not present

## 2017-05-20 DIAGNOSIS — J019 Acute sinusitis, unspecified: Secondary | ICD-10-CM | POA: Diagnosis not present

## 2017-07-05 DIAGNOSIS — F329 Major depressive disorder, single episode, unspecified: Secondary | ICD-10-CM | POA: Diagnosis not present

## 2017-07-05 DIAGNOSIS — Z1331 Encounter for screening for depression: Secondary | ICD-10-CM | POA: Diagnosis not present

## 2017-07-05 DIAGNOSIS — G2581 Restless legs syndrome: Secondary | ICD-10-CM | POA: Diagnosis not present

## 2017-07-05 DIAGNOSIS — Z6824 Body mass index (BMI) 24.0-24.9, adult: Secondary | ICD-10-CM | POA: Diagnosis not present

## 2017-07-31 DIAGNOSIS — M4150 Other secondary scoliosis, site unspecified: Secondary | ICD-10-CM | POA: Diagnosis not present

## 2017-08-07 ENCOUNTER — Other Ambulatory Visit (HOSPITAL_COMMUNITY): Payer: Self-pay | Admitting: Neurosurgery

## 2017-08-07 ENCOUNTER — Other Ambulatory Visit: Payer: Self-pay | Admitting: Neurosurgery

## 2017-08-07 ENCOUNTER — Ambulatory Visit (HOSPITAL_COMMUNITY)
Admission: RE | Admit: 2017-08-07 | Discharge: 2017-08-07 | Disposition: A | Discharge status: Home / Self Care | Payer: BLUE CROSS/BLUE SHIELD | Source: Ambulatory Visit | Attending: Vascular Surgery | Admitting: Vascular Surgery

## 2017-08-07 DIAGNOSIS — I739 Peripheral vascular disease, unspecified: Secondary | ICD-10-CM | POA: Diagnosis not present

## 2017-08-07 DIAGNOSIS — F17200 Nicotine dependence, unspecified, uncomplicated: Secondary | ICD-10-CM | POA: Diagnosis not present

## 2017-08-08 DIAGNOSIS — M4150 Other secondary scoliosis, site unspecified: Secondary | ICD-10-CM | POA: Diagnosis not present

## 2017-08-08 DIAGNOSIS — M47816 Spondylosis without myelopathy or radiculopathy, lumbar region: Secondary | ICD-10-CM | POA: Diagnosis not present

## 2017-08-22 DIAGNOSIS — G629 Polyneuropathy, unspecified: Secondary | ICD-10-CM | POA: Diagnosis not present

## 2017-08-22 DIAGNOSIS — R030 Elevated blood-pressure reading, without diagnosis of hypertension: Secondary | ICD-10-CM | POA: Diagnosis not present

## 2017-08-22 DIAGNOSIS — R200 Anesthesia of skin: Secondary | ICD-10-CM | POA: Diagnosis not present

## 2017-09-05 DIAGNOSIS — G629 Polyneuropathy, unspecified: Secondary | ICD-10-CM | POA: Diagnosis not present

## 2017-10-14 DIAGNOSIS — Z6824 Body mass index (BMI) 24.0-24.9, adult: Secondary | ICD-10-CM | POA: Diagnosis not present

## 2017-10-14 DIAGNOSIS — M4686 Other specified inflammatory spondylopathies, lumbar region: Secondary | ICD-10-CM | POA: Diagnosis not present

## 2017-10-14 DIAGNOSIS — L819 Disorder of pigmentation, unspecified: Secondary | ICD-10-CM | POA: Diagnosis not present

## 2017-10-14 DIAGNOSIS — K5909 Other constipation: Secondary | ICD-10-CM | POA: Diagnosis not present

## 2017-10-29 DIAGNOSIS — D225 Melanocytic nevi of trunk: Secondary | ICD-10-CM | POA: Diagnosis not present

## 2017-10-29 DIAGNOSIS — L578 Other skin changes due to chronic exposure to nonionizing radiation: Secondary | ICD-10-CM | POA: Diagnosis not present

## 2017-10-29 DIAGNOSIS — L820 Inflamed seborrheic keratosis: Secondary | ICD-10-CM | POA: Diagnosis not present

## 2017-10-29 DIAGNOSIS — L821 Other seborrheic keratosis: Secondary | ICD-10-CM | POA: Diagnosis not present

## 2017-10-29 DIAGNOSIS — D485 Neoplasm of uncertain behavior of skin: Secondary | ICD-10-CM | POA: Diagnosis not present

## 2017-11-19 ENCOUNTER — Ambulatory Visit (INDEPENDENT_AMBULATORY_CARE_PROVIDER_SITE_OTHER): Payer: BLUE CROSS/BLUE SHIELD | Admitting: Diagnostic Neuroimaging

## 2017-11-19 ENCOUNTER — Encounter: Payer: Self-pay | Admitting: Diagnostic Neuroimaging

## 2017-11-19 VITALS — BP 140/94 | HR 90 | Ht 74.0 in | Wt 182.6 lb

## 2017-11-19 DIAGNOSIS — G959 Disease of spinal cord, unspecified: Secondary | ICD-10-CM

## 2017-11-19 DIAGNOSIS — R200 Anesthesia of skin: Secondary | ICD-10-CM

## 2017-11-19 DIAGNOSIS — M4714 Other spondylosis with myelopathy, thoracic region: Secondary | ICD-10-CM

## 2017-11-19 NOTE — Patient Instructions (Signed)
MRI thoracic spine

## 2017-11-19 NOTE — Progress Notes (Signed)
[facility]  PATIENT: Adam Burnett DOB: 04-06-1960  REFERRING CLINICIAN: H Pool HISTORY FROM: patient and wife  REASON FOR VISIT: new consult    HISTORICAL  CHIEF COMPLAINT:  Chief Complaint  Patient presents with  . New Patient (Initial Visit)    Rm 7, wife, [name].  Balance, falls, top feet numbness, cramping legs to back, since back surgery.  . Polyneuropathy    Dr. Jordan Likes.     HISTORY OF PRESENT ILLNESS:   57 year old male here for evaluation of lower extremity numbness and pain.  In 2010 patient was having low back pain rating to left leg.  He was diagnosed with disk bulging and bone spurs and treated with lumbar spine surgery.  Symptoms resolved.  In 2016 symptoms returned now radiating from his left lower back into his left inner thigh and leg down to his foot.  He underwent conservative management and then ultimately a second lumbar spine surgery in November 2018.  Patient underwent rehabilitation and pain management for several weeks.  In January or February 2019 patient noticed that he was developing muscle spasms, cramps, numbness and tingling in his feet and legs.  His balance progressively worsened.  He developed constipation and perineal anesthesia/numbness.  Symptoms progressively worsened until June 2019, and had a follow-up in spine surgery clinic.  MRI of the lumbar spine showed stable postoperative findings and EMG nerve conduction study was unremarkable.  However due to progressive symptoms, concern for possible small fiber neuropathy was raised and patient was referred here for evaluation.  Today patient has significant spastic unsteady gait.  He is barely able to walk using a cane.  He is having significant problems using the bathroom.  Symptoms have worsened in the last 4 to 6 weeks.  No problems with his fingers or hands.  No problems with speech or swallowing.  No vision changes.  Patient has history of pulmonary sarcoidosis  diagnosed in 1991, previously on prednisone, now in remission.   REVIEW OF SYSTEMS: Full 14 system review of systems performed and negative with exception of: Weight loss fatigue easy bruising numbness weakness dizziness joint pain not enough sleep disinterest activities incontinence impotence ringing in ears.  ALLERGIES: Allergies  Allergen Reactions  . Penicillins Hives, Shortness Of Breath and Rash    PATIENT HAS HAD A PCN REACTION WITH IMMEDIATE RASH, FACIAL/TONGUE/THROAT SWELLING, SOB, OR LIGHTHEADEDNESS WITH HYPOTENSION:  #  #  #  YES  #  #  #   Has patient had a PCN reaction causing severe rash involving mucus membranes or skin necrosis: No PATIENT HAS HAD A PCN REACTION THAT REQUIRED HOSPITALIZATION:  #  #  #  YES  #  #  #   Has patient had a PCN reaction occurring within the last 10 years: No If all of the above answers are "NO", then may proceed with Cephalosporin use.     HOME MEDICATIONS: Outpatient Medications Prior to Visit  Medication Sig Dispense Refill  . naproxen sodium (ALEVE) 220 MG tablet Take 220 mg by mouth daily as needed.    . baclofen (LIORESAL) 10 MG tablet Take 1 tablet (10 mg total) by mouth 3 (three) times daily. 90 tablet 1  . baclofen (LIORESAL) 10 MG tablet Take 1 tablet (10 mg total) by mouth 3 (three) times daily. 30 each 0  . gabapentin (NEURONTIN) 300 MG capsule Take 1 capsule (300 mg total) by mouth 3 (three) times daily. 90 capsule 2  . oxyCODONE-acetaminophen (PERCOCET) 7.5-325 MG  tablet Take 1 tablet by mouth every 4 (four) hours as needed for severe pain. 60 tablet 0  . venlafaxine XR (EFFEXOR-XR) 75 MG 24 hr capsule Take 75 mg daily with breakfast by mouth.     No facility-administered medications prior to visit.     PAST MEDICAL HISTORY: Past Medical History:  Diagnosis Date  . Anxiety   . Arthritis    R knee.   . Depression   . Hemorrhoids   . Rectal pain   . Sarcoidosis     PAST SURGICAL HISTORY: Past Surgical History:    Procedure Laterality Date  . ANTERIOR CRUCIATE LIGAMENT REPAIR Right   . ANTERIOR LAT LUMBAR FUSION N/A 01/28/2017   Procedure: Lumbar Three- Four Lumbar Four- Five Anterior interbody fusion via transpsoas approach;  Surgeon: Ditty, Loura Halt, MD;  Location: [facility] OR;  Service: Neurosurgery;  Laterality: N/A;  L3-4 L4-5 Anterior interbody fusion via transpsoas approach/L3-S1 Laminectomy for decompression with posterior column osteotomies/L5-S1 Transforaminal lumbar interbody fusion/L3-S1 Posterior segmental ins  . APPLICATION OF ROBOTIC ASSISTANCE FOR SPINAL PROCEDURE N/A 01/28/2017   Procedure: APPLICATION OF ROBOTIC ASSISTANCE FOR SPINAL PROCEDURE;  Surgeon: Ditty, Loura Halt, MD;  Location: [facility] OR;  Service: Neurosurgery;  Laterality: N/A;  . BACK SURGERY  2007  . COLONOSCOPY    . HAND TENDON SURGERY Right   . HEMORRHOID SURGERY  2005 - approximate  . SPHINCTEROTOMY  2007  . TRANSFORAMINAL LUMBAR INTERBODY FUSION (TLIF) WITH PEDICLE SCREW FIXATION 3 LEVEL  01/28/2017   Procedure: Lumbar Three-Sacral One Laminectomy for decompression with posterior column osteotomies, Lumbar five-Sacral One Transforaminal lumbar interbody fusion/Lumbar Three-Sacral One Posterior segmental instrumented fusion;  Surgeon: Ditty, Loura Halt, MD;  Location: [facility] OR;  Service: Neurosurgery;;    FAMILY HISTORY: No family history on file.  SOCIAL HISTORY: Social History   Socioeconomic History  . Marital status: Married    Spouse name: Not on file  . Number of children: Not on file  . Years of education: Not on file  . Highest education level: Not on file  Occupational History  . Not on file  Social Needs  . Financial resource strain: Not on file  . Food insecurity:    Worry: Not on file    Inability: Not on file  . Transportation needs:    Medical: Not on file    Non-medical: Not on file  Tobacco Use  . Smoking status: Current Every Day Smoker    Packs/day: 1.00    Types: Cigarettes  .  Smokeless tobacco: Never Used  Substance and Sexual Activity  . Alcohol use: No  . Drug use: No  . Sexual activity: Not on file  Lifestyle  . Physical activity:    Days per week: Not on file    Minutes per session: Not on file  . Stress: Not on file  Relationships  . Social connections:    Talks on phone: Not on file    Gets together: Not on file    Attends religious service: Not on file    Active member of club or organization: Not on file    Attends meetings of clubs or organizations: Not on file    Relationship status: Not on file  . Intimate partner violence:    Fear of current or ex partner: Not on file    Emotionally abused: Not on file    Physically abused: Not on file    Forced sexual activity: Not on file  Other Topics Concern  . Not  on file  Social History Narrative  . Not on file     PHYSICAL EXAM  GENERAL EXAM/CONSTITUTIONAL: Vitals:  Vitals:   11/19/17 1520  BP: (!) 140/94  Pulse: 90  Weight: 182 lb 9.6 oz (82.8 kg)  Height: 6\' 2"  (1.88 m)     Body mass index is 23.44 kg/m. Wt Readings from Last 3 Encounters:  11/19/17 182 lb 9.6 oz (82.8 kg)  01/28/17 211 lb 10.3 oz (96 kg)  01/18/12 202 lb 12.8 oz (92 kg)     Patient is in no distress; well developed, nourished and groomed; neck is supple  CARDIOVASCULAR:  Examination of carotid arteries is normal; no carotid bruits  Regular rate and rhythm, no murmurs  Examination of peripheral vascular system by observation and palpation is normal  EYES:  Ophthalmoscopic exam of optic discs and posterior segments is normal; no papilledema or hemorrhages  Visual Acuity Screening   Right eye Left eye Both eyes  Without correction:     With correction: 20/30 20/40      MUSCULOSKELETAL:  Gait, strength, tone, movements noted in Neurologic exam below  NEUROLOGIC: MENTAL STATUS:  No flowsheet data found.  awake, alert, oriented to person, place and time  recent and remote memory  intact  normal attention and concentration  language fluent, comprehension intact, naming intact  fund of knowledge appropriate  CRANIAL NERVE:   2nd - no papilledema on fundoscopic exam  2nd, 3rd, 4th, 6th - pupils equal and reactive to light, visual fields full to confrontation, extraocular muscles intact, no nystagmus  5th - facial sensation symmetric  7th - facial strength symmetric  8th - hearing intact  9th - palate elevates symmetrically, uvula midline  11th - shoulder shrug symmetric  12th - tongue protrusion midline  MOTOR:   normal bulk and tone, full strength in the BUE  INCREASED TONE IN BLE --> HF 4, KE 4, KF 4+, DF 4  SUSTAINED CLONUS IN BILATERAL FEET / ANKLES  SENSORY:   normal and symmetric to light touch  DECR PP IN BILATERAL FEET / ANKLES UP TO KNEES  SLIGHT DECR PP IN FINGERS  DECR TEMP AND VIB IN FEET  SENSORY LEVEL AT T8 AND LOWER (TO PINPRICK)  COORDINATION:   finger-nose-finger, fine finger movements --> normal in BUE  REFLEXES:   deep tendon reflexes --> BUE 2; KNEES 3 (POSITIVE SUPRAPATELLAR AND CROSSED ADDUCTORS); ANKLES 3; RIGHT TOE UPGOING WITH PLANTAR STIM; LEFT TOE UP AND DOWN  GAIT/STATION:   SPASTIC, UNSTEADY GAIT; NARROW SCISSORING GAIT     DIAGNOSTIC DATA (LABS, IMAGING, TESTING) - I reviewed patient records, labs, notes, testing and imaging myself where available.  Lab Results  Component Value Date   WBC 13.8 (H) 01/29/2017   HGB 8.8 (L) 01/29/2017   HCT 26.9 (L) 01/29/2017   MCV 97.1 01/29/2017   PLT 256 01/29/2017      Component Value Date/Time   NA 135 01/29/2017 0404   K 4.1 01/29/2017 0404   CL 104 01/29/2017 0404   CO2 26 01/29/2017 0404   GLUCOSE 153 (H) 01/29/2017 0404   BUN 12 01/29/2017 0404   CREATININE 0.95 01/29/2017 0404   CALCIUM 7.8 (L) 01/29/2017 0404   GFRNONAA >60 01/29/2017 0404   GFRAA >60 01/29/2017 0404   No results found for: CHOL, HDL, LDLCALC, LDLDIRECT, TRIG, CHOLHDL No  results found for: HGBA1C No results found for: VITAMINB12 No results found for: TSH   12/31/16 CT THORACIC SPINE [I reviewed images myself  and agree with interpretation. In addition, there is moderate spinal stenosis at T7, mild spinal stenosis at T7-8, T8-9 levels and moderate-severe spinal stenosis at T10-11 level. -VRP]  - No fracture or focal lesion. Minimal curvature. No significant disc finding. Thoracic facet arthritis from T4-5 through T11-12.  12/31/16 CT LUMBAR SPINE [I reviewed images myself and agree with interpretation. Also large anterior bone bridging at L3-4.-VRP]  - Previous left hemilaminectomy at L4-5 and L5-S1. - L3-4 disc protrusion more prominent towards the left with stenosis of both lateral recesses left more than right. - L4-5 lateral recess and foraminal narrowing. - L5-S1 lateral recess and foraminal narrowing.   ASSESSMENT AND PLAN  56 y.o. year old male here with history of lumbar spine surgery x2 in 2010 in 2018, now with progressive lower extremity numbness, weakness, spastic gait, hyperreflexia, incontinence, perineal anesthesia.  Signs and symptoms concerning for thoracic myelopathy.  Of note CT thoracic spine from October 2018 demonstrates multilevel spinal stenosis from T7 down to T10 level.  I suspect that this has worsened since that time.  I will order urgent MRI of thoracic spine and follow-up with patient pending test results.  Localization: thoracic spinal cord  Dx:  1. Myelopathy (HCC)   2. Myelopathy of thoracic region     PLAN:  - urgent MRI thoracic spine (with and without) - rule out spinal cord compression (T8-T10 level) or other autoimmune / inflamm causes (h/o sarcoidosis) - screening labs  Orders Placed This Encounter  Procedures  . MR THORACIC SPINE W WO CONTRAST  . CBC with Differential/Platelet  . Comprehensive metabolic panel  . Vitamin B12  . Hemoglobin A1c  . TSH   Return pending test results.    Suanne Marker, MD 11/19/2017, 3:37 PM Certified in Neurology, Neurophysiology and Neuroimaging  [facility] [address], [address] [place], [place] [address] [phone]

## 2017-11-20 ENCOUNTER — Telehealth: Payer: Self-pay | Admitting: Diagnostic Neuroimaging

## 2017-11-20 ENCOUNTER — Ambulatory Visit (HOSPITAL_COMMUNITY)
Admission: RE | Admit: 2017-11-20 | Discharge: 2017-11-20 | Disposition: A | Discharge status: Home / Self Care | Payer: BLUE CROSS/BLUE SHIELD | Source: Ambulatory Visit | Attending: Diagnostic Neuroimaging | Admitting: Diagnostic Neuroimaging

## 2017-11-20 DIAGNOSIS — M8938 Hypertrophy of bone, other site: Secondary | ICD-10-CM | POA: Diagnosis not present

## 2017-11-20 DIAGNOSIS — G959 Disease of spinal cord, unspecified: Secondary | ICD-10-CM | POA: Insufficient documentation

## 2017-11-20 DIAGNOSIS — M4804 Spinal stenosis, thoracic region: Secondary | ICD-10-CM | POA: Insufficient documentation

## 2017-11-20 DIAGNOSIS — M4714 Other spondylosis with myelopathy, thoracic region: Secondary | ICD-10-CM

## 2017-11-20 LAB — COMPREHENSIVE METABOLIC PANEL
A/G RATIO: 1.8 (ref 1.2–2.2)
ALT: 9 IU/L (ref 0–44)
AST: 15 IU/L (ref 0–40)
Albumin: 4.9 g/dL (ref 3.5–5.5)
Alkaline Phosphatase: 77 IU/L (ref 39–117)
BILIRUBIN TOTAL: 0.3 mg/dL (ref 0.0–1.2)
BUN/Creatinine Ratio: 13 (ref 9–20)
BUN: 13 mg/dL (ref 6–24)
CHLORIDE: 103 mmol/L (ref 96–106)
CO2: 21 mmol/L (ref 20–29)
Calcium: 10 mg/dL (ref 8.7–10.2)
Creatinine, Ser: 1.02 mg/dL (ref 0.76–1.27)
GFR calc non Af Amer: 81 mL/min/{1.73_m2} (ref >59)
GFR, EST AFRICAN AMERICAN: 94 mL/min/{1.73_m2} (ref >59)
Globulin, Total: 2.7 g/dL (ref 1.5–4.5)
Glucose: 89 mg/dL (ref 65–99)
POTASSIUM: 5 mmol/L (ref 3.5–5.2)
Sodium: 143 mmol/L (ref 134–144)
Total Protein: 7.6 g/dL (ref 6.0–8.5)

## 2017-11-20 LAB — CBC WITH DIFFERENTIAL/PLATELET
BASOS: 1 %
Basophils Absolute: 0.1 10*3/uL (ref 0.0–0.2)
EOS (ABSOLUTE): 0.2 10*3/uL (ref 0.0–0.4)
Eos: 2 %
Hematocrit: 45.6 % (ref 37.5–51.0)
Hemoglobin: 15.3 g/dL (ref 13.0–17.7)
Immature Grans (Abs): 0 10*3/uL (ref 0.0–0.1)
Immature Granulocytes: 0 %
Lymphocytes Absolute: 3 10*3/uL (ref 0.7–3.1)
Lymphs: 30 %
MCH: 30.8 pg (ref 26.6–33.0)
MCHC: 33.6 g/dL (ref 31.5–35.7)
MCV: 92 fL (ref 79–97)
MONOS ABS: 0.9 10*3/uL (ref 0.1–0.9)
Monocytes: 9 %
NEUTROS ABS: 5.8 10*3/uL (ref 1.4–7.0)
Neutrophils: 58 %
PLATELETS: 484 10*3/uL — AB (ref 150–450)
RBC: 4.97 x10E6/uL (ref 4.14–5.80)
RDW: 13.5 % (ref 12.3–15.4)
WBC: 10 10*3/uL (ref 3.4–10.8)

## 2017-11-20 LAB — TSH: TSH: 5.24 u[IU]/mL — AB (ref 0.450–4.500)

## 2017-11-20 LAB — VITAMIN B12: Vitamin B-12: 670 pg/mL (ref 232–1245)

## 2017-11-20 LAB — HEMOGLOBIN A1C
ESTIMATED AVERAGE GLUCOSE: 114 mg/dL
HEMOGLOBIN A1C: 5.6 % (ref 4.8–5.6)

## 2017-11-20 MED ORDER — GADOBUTROL 1 MMOL/ML IV SOLN
8.0000 mL | Freq: Once | INTRAVENOUS | Status: AC | PRN
  Administered 2017-11-20: 8 mL via INTRAVENOUS

## 2017-11-20 NOTE — Telephone Encounter (Signed)
Noted  

## 2017-11-20 NOTE — Telephone Encounter (Signed)
Just an FYI.  Patient is scheduled for this evening for 6:30 pm at [facility]. Since it was marked as emergency they have your number to call you with the results.

## 2017-11-20 NOTE — Telephone Encounter (Signed)
I didn't get to speak with the patient before he left yesterday about his MRI. I left a voicemail for him to call me back about discussing his MRI if he would be okay with having it at [facility].   [employer] Auth: [id number] (exp. 11/20/17 to 12/19/17)

## 2017-11-21 ENCOUNTER — Telehealth: Payer: Self-pay | Admitting: *Deleted

## 2017-11-21 NOTE — Telephone Encounter (Signed)
Spoke to pt and relayed that lab unremarkable except for elevated TSH, will forward to pcp.  Pt verbalized understanding.

## 2017-11-21 NOTE — Telephone Encounter (Signed)
-----   Message from Suanne MarkerVikram R Penumalli, MD sent at 11/20/2017  3:58 PM EDT ----- Unremarkable labs, except slightly elevated TSH (rec follow up with PCP). Otherwise, will follow up MRI thoracic spine. Continue current plan. -VRP

## 2017-11-25 DIAGNOSIS — E039 Hypothyroidism, unspecified: Secondary | ICD-10-CM | POA: Diagnosis not present

## 2017-11-25 DIAGNOSIS — Z6823 Body mass index (BMI) 23.0-23.9, adult: Secondary | ICD-10-CM | POA: Diagnosis not present

## 2017-11-25 NOTE — Telephone Encounter (Signed)
Fax confirmation received to Dr. Foye Deerouglas Schultz [phone].

## 2017-12-19 ENCOUNTER — Other Ambulatory Visit: Payer: Self-pay | Admitting: Neurosurgery

## 2017-12-19 DIAGNOSIS — M4804 Spinal stenosis, thoracic region: Secondary | ICD-10-CM | POA: Diagnosis not present

## 2017-12-30 DIAGNOSIS — E039 Hypothyroidism, unspecified: Secondary | ICD-10-CM | POA: Diagnosis not present

## 2018-01-02 NOTE — Pre-Procedure Instructions (Signed)
Adam Burnett  01/02/2018      [id number] - [place], [place] - [address] [facility] [place] [address] Phone: [phone] Fax: [phone]    Your procedure is scheduled on Mon. Nov. 4, 2019 from 11:26AM-1:29PM  Report to [facility] Admitting Entrance "A" at 9:20AM  Call this number if you have problems the morning of surgery:  [phone]   Remember:  Do not eat or drink after midnight on Nov. 3rd    Take these medicines the morning of surgery with A SIP OF WATER: Levothyroxine (SYNTHROID, LEVOTHROID)  7 days before surgery (01/06/18), stop taking all Other Aspirin Products, Vitamins, Fish oils, and Herbal medications. Also stop all NSAIDS i.e. Advil, Ibuprofen, Motrin, Aleve, Anaprox, Naproxen, BC, Goody Powders, and all Supplements.    Do not wear jewelry.  Do not wear lotions, powders, colognes, or deodorant.  Do not shave 48 hours prior to surgery.  Men may shave face.  Do not bring valuables to the hospital.  [facility] is not responsible for any belongings or valuables.  Contacts, dentures or bridgework may not be worn into surgery.  Leave your suitcase in the car.  After surgery it may be brought to your room.  For patients admitted to the hospital, discharge time will be determined by your treatment team.  Patients discharged the day of surgery will not be allowed to drive home.   Special instructions: [facility]- Preparing For Surgery  Before surgery, you can play an important role. Because skin is not sterile, your skin needs to be as free of germs as possible. You can reduce the number of germs on your skin by washing with CHG (chlorahexidine gluconate) Soap before surgery.  CHG is an antiseptic cleaner which kills germs and bonds with the skin to continue killing germs even after washing.    Oral Hygiene is also important to reduce your risk of infection.  Remember - BRUSH YOUR TEETH THE MORNING OF SURGERY WITH  YOUR REGULAR TOOTHPASTE  Please do not use if you have an allergy to CHG or antibacterial soaps. If your skin becomes reddened/irritated stop using the CHG.  Do not shave (including legs and underarms) for at least 48 hours prior to first CHG shower. It is OK to shave your face.  Please follow these instructions carefully.   1. Shower the NIGHT BEFORE SURGERY and the MORNING OF SURGERY with CHG.   2. If you chose to wash your hair, wash your hair first as usual with your normal shampoo.  3. After you shampoo, rinse your hair and body thoroughly to remove the shampoo.  4. Use CHG as you would any other liquid soap. You can apply CHG directly to the skin and wash gently with a scrungie or a clean washcloth.   5. Apply the CHG Soap to your body ONLY FROM THE NECK DOWN.  Do not use on open wounds or open sores. Avoid contact with your eyes, ears, mouth and genitals (private parts). Wash Face and genitals (private parts)  with your normal soap.  6. Wash thoroughly, paying special attention to the area where your surgery will be performed.  7. Thoroughly rinse your body with warm water from the neck down.  8. DO NOT shower/wash with your normal soap after using and rinsing off the CHG Soap.  9. Pat yourself dry with a CLEAN TOWEL.  10. Wear CLEAN PAJAMAS to bed the night before surgery, wear comfortable clothes  the morning of surgery  11. Place CLEAN SHEETS on your bed the night of your first shower and DO NOT SLEEP WITH PETS.  Day of Surgery:  Do not apply any deodorants/lotions.  Please wear clean clothes to the hospital/surgery center.   Remember to brush your teeth WITH YOUR REGULAR TOOTHPASTE.  Please read over the following fact sheets that you were given. Pain Booklet, Coughing and Deep Breathing, MRSA Information and Surgical Site Infection Prevention

## 2018-01-03 ENCOUNTER — Other Ambulatory Visit: Payer: Self-pay

## 2018-01-03 ENCOUNTER — Encounter (HOSPITAL_COMMUNITY): Payer: Self-pay

## 2018-01-03 ENCOUNTER — Encounter (HOSPITAL_COMMUNITY)
Admission: RE | Admit: 2018-01-03 | Discharge: 2018-01-03 | Disposition: A | Discharge status: Home / Self Care | Payer: BLUE CROSS/BLUE SHIELD | Source: Ambulatory Visit | Attending: Neurosurgery | Admitting: Neurosurgery

## 2018-01-03 DIAGNOSIS — Z01812 Encounter for preprocedural laboratory examination: Secondary | ICD-10-CM | POA: Insufficient documentation

## 2018-01-03 LAB — CBC WITH DIFFERENTIAL/PLATELET
Abs Immature Granulocytes: 0.03 10*3/uL (ref 0.00–0.07)
Basophils Absolute: 0.1 10*3/uL (ref 0.0–0.1)
Basophils Relative: 1 %
Eosinophils Absolute: 0.3 10*3/uL (ref 0.0–0.5)
Eosinophils Relative: 3 %
HCT: 46.9 % (ref 39.0–52.0)
HEMOGLOBIN: 15 g/dL (ref 13.0–17.0)
Immature Granulocytes: 0 %
LYMPHS ABS: 3.1 10*3/uL (ref 0.7–4.0)
Lymphocytes Relative: 35 %
MCH: 30.2 pg (ref 26.0–34.0)
MCHC: 32 g/dL (ref 30.0–36.0)
MCV: 94.6 fL (ref 80.0–100.0)
MONO ABS: 0.8 10*3/uL (ref 0.1–1.0)
Monocytes Relative: 9 %
NEUTROS ABS: 4.6 10*3/uL (ref 1.7–7.7)
Neutrophils Relative %: 52 %
Platelets: 457 10*3/uL — ABNORMAL HIGH (ref 150–400)
RBC: 4.96 MIL/uL (ref 4.22–5.81)
RDW: 14.8 % (ref 11.5–15.5)
WBC: 8.8 10*3/uL (ref 4.0–10.5)
nRBC: 0 % (ref 0.0–0.2)

## 2018-01-03 LAB — SURGICAL PCR SCREEN
MRSA, PCR: NEGATIVE
Staphylococcus aureus: NEGATIVE

## 2018-01-03 LAB — BASIC METABOLIC PANEL
Anion gap: 9 (ref 5–15)
BUN: 10 mg/dL (ref 6–20)
CHLORIDE: 106 mmol/L (ref 98–111)
CO2: 24 mmol/L (ref 22–32)
Calcium: 9.6 mg/dL (ref 8.9–10.3)
Creatinine, Ser: 0.89 mg/dL (ref 0.61–1.24)
GFR calc Af Amer: >60 mL/min (ref >60)
GFR calc non Af Amer: >60 mL/min (ref >60)
Glucose, Bld: 100 mg/dL — ABNORMAL HIGH (ref 70–99)
POTASSIUM: 4.3 mmol/L (ref 3.5–5.1)
SODIUM: 139 mmol/L (ref 135–145)

## 2018-01-03 NOTE — Progress Notes (Signed)
PCP - Denies  Cardiologist - Denies  Chest x-ray - Denies  EKG - 01/28/17 (E)  Stress Test - Denies  ECHO - Denies  Cardiac Cath - 02/21/16 (CE)  AICD- n/a PM- n/a LOOP- n/a  Sleep Study - Denies CPAP - Denies  LABS- 01/03/18: CBC w/D, BMP, PCR  ASA- Denies   Anesthesia- No  Pt denies having chest pain, sob, or fever at this time. All instructions explained to the pt, with a verbal understanding of the material. Pt agrees to go over the instructions while at home for a better understanding. The opportunity to ask questions was provided.

## 2018-01-13 ENCOUNTER — Ambulatory Visit (HOSPITAL_COMMUNITY): Payer: BLUE CROSS/BLUE SHIELD | Admitting: Physician Assistant

## 2018-01-13 ENCOUNTER — Encounter (HOSPITAL_COMMUNITY)
Admission: RE | Disposition: A | Discharge status: Home / Self Care | Payer: Self-pay | Source: Ambulatory Visit | Attending: Neurosurgery

## 2018-01-13 ENCOUNTER — Ambulatory Visit (HOSPITAL_COMMUNITY): Payer: BLUE CROSS/BLUE SHIELD

## 2018-01-13 ENCOUNTER — Observation Stay (HOSPITAL_COMMUNITY)
Admission: RE | Admit: 2018-01-13 | Discharge: 2018-01-14 | Disposition: A | Discharge status: Home / Self Care | Payer: BLUE CROSS/BLUE SHIELD | Source: Ambulatory Visit | Attending: Neurosurgery | Admitting: Neurosurgery

## 2018-01-13 ENCOUNTER — Encounter (HOSPITAL_COMMUNITY): Payer: Self-pay

## 2018-01-13 ENCOUNTER — Ambulatory Visit (HOSPITAL_COMMUNITY): Payer: BLUE CROSS/BLUE SHIELD | Admitting: Certified Registered Nurse Anesthetist

## 2018-01-13 ENCOUNTER — Other Ambulatory Visit: Payer: Self-pay

## 2018-01-13 DIAGNOSIS — Z981 Arthrodesis status: Secondary | ICD-10-CM | POA: Diagnosis not present

## 2018-01-13 DIAGNOSIS — D869 Sarcoidosis, unspecified: Secondary | ICD-10-CM | POA: Insufficient documentation

## 2018-01-13 DIAGNOSIS — Z419 Encounter for procedure for purposes other than remedying health state, unspecified: Secondary | ICD-10-CM

## 2018-01-13 DIAGNOSIS — F17210 Nicotine dependence, cigarettes, uncomplicated: Secondary | ICD-10-CM | POA: Insufficient documentation

## 2018-01-13 DIAGNOSIS — Z880 Allergy status to penicillin: Secondary | ICD-10-CM | POA: Diagnosis not present

## 2018-01-13 DIAGNOSIS — Z791 Long term (current) use of non-steroidal anti-inflammatories (NSAID): Secondary | ICD-10-CM | POA: Diagnosis not present

## 2018-01-13 DIAGNOSIS — Z79890 Hormone replacement therapy (postmenopausal): Secondary | ICD-10-CM | POA: Diagnosis not present

## 2018-01-13 DIAGNOSIS — M4714 Other spondylosis with myelopathy, thoracic region: Secondary | ICD-10-CM | POA: Diagnosis not present

## 2018-01-13 DIAGNOSIS — M4804 Spinal stenosis, thoracic region: Principal | ICD-10-CM | POA: Diagnosis present

## 2018-01-13 DIAGNOSIS — G992 Myelopathy in diseases classified elsewhere: Secondary | ICD-10-CM | POA: Diagnosis not present

## 2018-01-13 DIAGNOSIS — G959 Disease of spinal cord, unspecified: Secondary | ICD-10-CM | POA: Diagnosis not present

## 2018-01-13 HISTORY — PX: THORACIC DISCECTOMY: SHX6113

## 2018-01-13 SURGERY — THORACIC DISCECTOMY
Anesthesia: General | Site: Back

## 2018-01-13 MED ORDER — VANCOMYCIN HCL IN DEXTROSE 1-5 GM/200ML-% IV SOLN
1000.0000 mg | INTRAVENOUS | Status: AC
  Administered 2018-01-13: 1000 mg via INTRAVENOUS
  Filled 2018-01-13: qty 200

## 2018-01-13 MED ORDER — HYDROCODONE-ACETAMINOPHEN 5-325 MG PO TABS: 1.0000 | ORAL_TABLET | ORAL | Status: DC | PRN

## 2018-01-13 MED ORDER — OXYCODONE HCL 5 MG PO TABS
ORAL_TABLET | ORAL | Status: AC
  Filled 2018-01-13: qty 1

## 2018-01-13 MED ORDER — PROPOFOL 10 MG/ML IV BOLUS
INTRAVENOUS | Status: AC
  Filled 2018-01-13: qty 20

## 2018-01-13 MED ORDER — ONDANSETRON HCL 4 MG/2ML IJ SOLN: 4.0000 mg | Freq: Four times a day (QID) | INTRAMUSCULAR | Status: DC | PRN

## 2018-01-13 MED ORDER — LACTATED RINGERS IV SOLN
INTRAVENOUS | Status: DC | PRN
  Administered 2018-01-13 (×2): via INTRAVENOUS

## 2018-01-13 MED ORDER — MIDAZOLAM HCL 2 MG/2ML IJ SOLN
INTRAMUSCULAR | Status: DC | PRN
  Administered 2018-01-13: 2 mg via INTRAVENOUS

## 2018-01-13 MED ORDER — HYDROMORPHONE HCL 1 MG/ML IJ SOLN
INTRAMUSCULAR | Status: AC
  Administered 2018-01-13: 0.5 mg via INTRAVENOUS
  Filled 2018-01-13: qty 1

## 2018-01-13 MED ORDER — FENTANYL CITRATE (PF) 250 MCG/5ML IJ SOLN
INTRAMUSCULAR | Status: AC
  Filled 2018-01-13: qty 5

## 2018-01-13 MED ORDER — DEXAMETHASONE SODIUM PHOSPHATE 10 MG/ML IJ SOLN
10.0000 mg | INTRAMUSCULAR | Status: AC
  Administered 2018-01-13: 10 mg via INTRAVENOUS
  Filled 2018-01-13: qty 1

## 2018-01-13 MED ORDER — OXYCODONE HCL 5 MG PO TABS
5.0000 mg | ORAL_TABLET | Freq: Once | ORAL | Status: DC | PRN
  Administered 2018-01-13: 5 mg via ORAL

## 2018-01-13 MED ORDER — ONDANSETRON HCL 4 MG/2ML IJ SOLN
INTRAMUSCULAR | Status: AC
  Filled 2018-01-13: qty 2

## 2018-01-13 MED ORDER — SODIUM CHLORIDE 0.9 % IV SOLN
INTRAVENOUS | Status: DC | PRN
  Administered 2018-01-13: 500 mL

## 2018-01-13 MED ORDER — CHLORHEXIDINE GLUCONATE CLOTH 2 % EX PADS: 6.0000 | MEDICATED_PAD | Freq: Once | CUTANEOUS | Status: DC

## 2018-01-13 MED ORDER — KETOROLAC TROMETHAMINE 15 MG/ML IJ SOLN
30.0000 mg | Freq: Four times a day (QID) | INTRAMUSCULAR | Status: DC
  Administered 2018-01-13 – 2018-01-14 (×3): 30 mg via INTRAVENOUS
  Filled 2018-01-13 (×3): qty 2

## 2018-01-13 MED ORDER — SODIUM CHLORIDE 0.9% FLUSH
3.0000 mL | Freq: Two times a day (BID) | INTRAVENOUS | Status: DC
  Administered 2018-01-13: 3 mL via INTRAVENOUS

## 2018-01-13 MED ORDER — FENTANYL CITRATE (PF) 250 MCG/5ML IJ SOLN
INTRAMUSCULAR | Status: DC | PRN
  Administered 2018-01-13: 100 ug via INTRAVENOUS
  Administered 2018-01-13 (×2): 50 ug via INTRAVENOUS

## 2018-01-13 MED ORDER — 0.9 % SODIUM CHLORIDE (POUR BTL) OPTIME
TOPICAL | Status: DC | PRN
  Administered 2018-01-13: 1000 mL

## 2018-01-13 MED ORDER — BUPIVACAINE HCL (PF) 0.25 % IJ SOLN
INTRAMUSCULAR | Status: DC | PRN
  Administered 2018-01-13: 20 mL

## 2018-01-13 MED ORDER — MENTHOL 3 MG MT LOZG: 1.0000 | LOZENGE | OROMUCOSAL | Status: DC | PRN

## 2018-01-13 MED ORDER — ONDANSETRON HCL 4 MG/2ML IJ SOLN
INTRAMUSCULAR | Status: DC | PRN
  Administered 2018-01-13: 4 mg via INTRAVENOUS

## 2018-01-13 MED ORDER — DIPHENHYDRAMINE HCL (SLEEP) 50 MG PO CAPS: 50.0000 mg | ORAL_CAPSULE | Freq: Every day | ORAL | Status: DC

## 2018-01-13 MED ORDER — ACETAMINOPHEN 650 MG RE SUPP: 650.0000 mg | RECTAL | Status: DC | PRN

## 2018-01-13 MED ORDER — HYDROMORPHONE HCL 1 MG/ML IJ SOLN: 1.0000 mg | INTRAMUSCULAR | Status: DC | PRN

## 2018-01-13 MED ORDER — ROCURONIUM BROMIDE 50 MG/5ML IV SOSY
PREFILLED_SYRINGE | INTRAVENOUS | Status: AC
  Filled 2018-01-13: qty 5

## 2018-01-13 MED ORDER — ACETAMINOPHEN 325 MG PO TABS: 650.0000 mg | ORAL_TABLET | ORAL | Status: DC | PRN

## 2018-01-13 MED ORDER — FENTANYL CITRATE (PF) 100 MCG/2ML IJ SOLN
INTRAMUSCULAR | Status: AC
  Administered 2018-01-13: 50 ug via INTRAVENOUS
  Filled 2018-01-13: qty 2

## 2018-01-13 MED ORDER — HYDROMORPHONE HCL 1 MG/ML IJ SOLN
0.2500 mg | INTRAMUSCULAR | Status: DC | PRN
  Administered 2018-01-13 (×2): 0.5 mg via INTRAVENOUS

## 2018-01-13 MED ORDER — CYCLOBENZAPRINE HCL 10 MG PO TABS
10.0000 mg | ORAL_TABLET | Freq: Three times a day (TID) | ORAL | Status: DC | PRN
  Administered 2018-01-13 – 2018-01-14 (×2): 10 mg via ORAL
  Filled 2018-01-13 (×2): qty 1

## 2018-01-13 MED ORDER — BUPIVACAINE HCL (PF) 0.25 % IJ SOLN
INTRAMUSCULAR | Status: AC
  Filled 2018-01-13: qty 30

## 2018-01-13 MED ORDER — LIDOCAINE 2% (20 MG/ML) 5 ML SYRINGE
INTRAMUSCULAR | Status: DC | PRN
  Administered 2018-01-13: 40 mg via INTRAVENOUS
  Administered 2018-01-13: 60 mg via INTRAVENOUS

## 2018-01-13 MED ORDER — PHENOL 1.4 % MT LIQD: 1.0000 | OROMUCOSAL | Status: DC | PRN

## 2018-01-13 MED ORDER — SODIUM CHLORIDE 0.9 % IV SOLN: 250.0000 mL | INTRAVENOUS | Status: DC

## 2018-01-13 MED ORDER — HYDROCODONE-ACETAMINOPHEN 10-325 MG PO TABS
2.0000 | ORAL_TABLET | ORAL | Status: DC | PRN
  Administered 2018-01-13 – 2018-01-14 (×4): 2 via ORAL
  Filled 2018-01-13 (×4): qty 2

## 2018-01-13 MED ORDER — MIDAZOLAM HCL 2 MG/2ML IJ SOLN
INTRAMUSCULAR | Status: AC
  Filled 2018-01-13: qty 2

## 2018-01-13 MED ORDER — HEMOSTATIC AGENTS (NO CHARGE) OPTIME
TOPICAL | Status: DC | PRN
  Administered 2018-01-13: 1

## 2018-01-13 MED ORDER — LIDOCAINE 2% (20 MG/ML) 5 ML SYRINGE
INTRAMUSCULAR | Status: AC
  Filled 2018-01-13: qty 5

## 2018-01-13 MED ORDER — FENTANYL CITRATE (PF) 100 MCG/2ML IJ SOLN
100.0000 ug | Freq: Once | INTRAMUSCULAR | Status: AC
  Administered 2018-01-13: 50 ug via INTRAVENOUS

## 2018-01-13 MED ORDER — ROCURONIUM BROMIDE 10 MG/ML (PF) SYRINGE
PREFILLED_SYRINGE | INTRAVENOUS | Status: DC | PRN
  Administered 2018-01-13: 50 mg via INTRAVENOUS

## 2018-01-13 MED ORDER — SODIUM CHLORIDE 0.9% FLUSH: 3.0000 mL | INTRAVENOUS | Status: DC | PRN

## 2018-01-13 MED ORDER — ONDANSETRON HCL 4 MG PO TABS: 4.0000 mg | ORAL_TABLET | Freq: Four times a day (QID) | ORAL | Status: DC | PRN

## 2018-01-13 MED ORDER — LEVOTHYROXINE SODIUM 100 MCG PO TABS
50.0000 ug | ORAL_TABLET | Freq: Every day | ORAL | Status: DC
  Administered 2018-01-14: 50 ug via ORAL
  Filled 2018-01-13: qty 1

## 2018-01-13 MED ORDER — SUGAMMADEX SODIUM 200 MG/2ML IV SOLN
INTRAVENOUS | Status: DC | PRN
  Administered 2018-01-13: 200 mg via INTRAVENOUS

## 2018-01-13 MED ORDER — PROPOFOL 10 MG/ML IV BOLUS
INTRAVENOUS | Status: DC | PRN
  Administered 2018-01-13: 160 mg via INTRAVENOUS

## 2018-01-13 MED ORDER — VANCOMYCIN HCL IN DEXTROSE 1-5 GM/200ML-% IV SOLN
1000.0000 mg | Freq: Once | INTRAVENOUS | Status: AC
  Administered 2018-01-13: 1000 mg via INTRAVENOUS
  Filled 2018-01-13: qty 200

## 2018-01-13 MED ORDER — KETOROLAC TROMETHAMINE 30 MG/ML IJ SOLN
INTRAMUSCULAR | Status: DC | PRN
  Administered 2018-01-13: 30 mg via INTRAVENOUS

## 2018-01-13 MED ORDER — MEPERIDINE HCL 50 MG/ML IJ SOLN: 6.2500 mg | INTRAMUSCULAR | Status: DC | PRN

## 2018-01-13 MED ORDER — OXYCODONE HCL 5 MG/5ML PO SOLN: 5.0000 mg | Freq: Once | ORAL | Status: DC | PRN

## 2018-01-13 MED ORDER — THROMBIN 5000 UNITS EX SOLR
CUTANEOUS | Status: AC
  Filled 2018-01-13: qty 15000

## 2018-01-13 MED ORDER — BISACODYL 5 MG PO TBEC: 10.0000 mg | DELAYED_RELEASE_TABLET | Freq: Every day | ORAL | Status: DC | PRN

## 2018-01-13 MED ORDER — PROMETHAZINE HCL 25 MG/ML IJ SOLN: 6.2500 mg | INTRAMUSCULAR | Status: DC | PRN

## 2018-01-13 MED ORDER — THROMBIN 5000 UNITS EX SOLR
CUTANEOUS | Status: DC | PRN
  Administered 2018-01-13 (×2): 5000 [IU] via TOPICAL

## 2018-01-13 SURGICAL SUPPLY — 55 items
ADH SKN CLS APL DERMABOND .7 (GAUZE/BANDAGES/DRESSINGS) ×1
APL SKNCLS STERI-STRIP NONHPOA (GAUZE/BANDAGES/DRESSINGS) ×1
BAG DECANTER FOR FLEXI CONT (MISCELLANEOUS) ×3 IMPLANT
BENZOIN TINCTURE PRP APPL 2/3 (GAUZE/BANDAGES/DRESSINGS) ×3 IMPLANT
BUR CUTTER 7.0 ROUND (BURR) ×3 IMPLANT
BUR MATCHSTICK NEURO 3.0 LAGG (BURR) IMPLANT
CANISTER SUCT 3000ML PPV (MISCELLANEOUS) ×3 IMPLANT
CARTRIDGE OIL MAESTRO DRILL (MISCELLANEOUS) ×1 IMPLANT
CLOSURE STERI-STRIP 1/2X4 (GAUZE/BANDAGES/DRESSINGS) ×1
CLOSURE WOUND 1/2 X4 (GAUZE/BANDAGES/DRESSINGS) ×1
CLSR STERI-STRIP ANTIMIC 1/2X4 (GAUZE/BANDAGES/DRESSINGS) ×1 IMPLANT
COVER WAND RF STERILE (DRAPES) ×3 IMPLANT
DECANTER SPIKE VIAL GLASS SM (MISCELLANEOUS) ×3 IMPLANT
DERMABOND ADVANCED (GAUZE/BANDAGES/DRESSINGS) ×2
DERMABOND ADVANCED .7 DNX12 (GAUZE/BANDAGES/DRESSINGS) ×1 IMPLANT
DIFFUSER DRILL AIR PNEUMATIC (MISCELLANEOUS) ×3 IMPLANT
DRAPE HALF SHEET 40X57 (DRAPES) IMPLANT
DRAPE LAPAROTOMY 100X72X124 (DRAPES) ×3 IMPLANT
DRAPE MICROSCOPE LEICA (MISCELLANEOUS) ×3 IMPLANT
DRAPE POUCH INSTRU U-SHP 10X18 (DRAPES) ×3 IMPLANT
DRAPE SURG 17X23 STRL (DRAPES) ×8 IMPLANT
DRSG OPSITE POSTOP 4X6 (GAUZE/BANDAGES/DRESSINGS) ×2 IMPLANT
ELECT REM PT RETURN 9FT ADLT (ELECTROSURGICAL) ×3
ELECTRODE REM PT RTRN 9FT ADLT (ELECTROSURGICAL) ×1 IMPLANT
GAUZE 4X4 16PLY RFD (DISPOSABLE) IMPLANT
GAUZE SPONGE 4X4 12PLY STRL (GAUZE/BANDAGES/DRESSINGS) ×3 IMPLANT
GLOVE BIOGEL PI IND STRL 7.0 (GLOVE) IMPLANT
GLOVE BIOGEL PI IND STRL 7.5 (GLOVE) IMPLANT
GLOVE BIOGEL PI INDICATOR 7.0 (GLOVE) ×2
GLOVE BIOGEL PI INDICATOR 7.5 (GLOVE) ×2
GLOVE ECLIPSE 9.0 STRL (GLOVE) ×3 IMPLANT
GLOVE EXAM NITRILE XL STR (GLOVE) IMPLANT
GLOVE SURG SS PI 7.0 STRL IVOR (GLOVE) ×8 IMPLANT
GOWN STRL REUS W/ TWL LRG LVL3 (GOWN DISPOSABLE) IMPLANT
GOWN STRL REUS W/ TWL XL LVL3 (GOWN DISPOSABLE) ×1 IMPLANT
GOWN STRL REUS W/TWL 2XL LVL3 (GOWN DISPOSABLE) IMPLANT
GOWN STRL REUS W/TWL LRG LVL3 (GOWN DISPOSABLE) ×6
GOWN STRL REUS W/TWL XL LVL3 (GOWN DISPOSABLE) ×3
KIT BASIN OR (CUSTOM PROCEDURE TRAY) ×3 IMPLANT
KIT TURNOVER KIT B (KITS) ×3 IMPLANT
NDL SPNL 22GX3.5 QUINCKE BK (NEEDLE) ×1 IMPLANT
NEEDLE HYPO 22GX1.5 SAFETY (NEEDLE) ×3 IMPLANT
NEEDLE SPNL 22GX3.5 QUINCKE BK (NEEDLE) ×3 IMPLANT
NS IRRIG 1000ML POUR BTL (IV SOLUTION) ×3 IMPLANT
OIL CARTRIDGE MAESTRO DRILL (MISCELLANEOUS) ×3
PACK LAMINECTOMY NEURO (CUSTOM PROCEDURE TRAY) ×3 IMPLANT
PAD ARMBOARD 7.5X6 YLW CONV (MISCELLANEOUS) ×9 IMPLANT
RUBBERBAND STERILE (MISCELLANEOUS) ×6 IMPLANT
SPONGE SURGIFOAM ABS GEL SZ50 (HEMOSTASIS) ×2 IMPLANT
STRIP CLOSURE SKIN 1/2X4 (GAUZE/BANDAGES/DRESSINGS) ×2 IMPLANT
SUT VIC AB 2-0 CT1 18 (SUTURE) ×3 IMPLANT
SUT VIC AB 3-0 SH 8-18 (SUTURE) ×3 IMPLANT
TOWEL GREEN STERILE (TOWEL DISPOSABLE) ×3 IMPLANT
TOWEL GREEN STERILE FF (TOWEL DISPOSABLE) ×3 IMPLANT
WATER STERILE IRR 1000ML POUR (IV SOLUTION) ×3 IMPLANT

## 2018-01-13 NOTE — Op Note (Signed)
Date of procedure: 01/13/2018  Date of dictation: Same  Service: Neurosurgery  Preoperative diagnosis: T10-T11 stenosis with myelopathy  Postoperative diagnosis: Same  Procedure Name: T10-T11 decompressive laminectomy  Surgeon:Dquan Cortopassi A.Curren Mohrmann, M.D.  Asst. Surgeon: Johnsie Cancel  Anesthesia: General  Indication: 56 year old male status post prior lumbar decompression and fusion surgery by another surgeon presents with ongoing bilateral lower extremity numbness and weakness.  Work-up demonstrates evidence of significant spondylosis and stenosis at T10-T11 and to a lesser degree T6-T7.  At T10-11 there is significant compression upon the spinal cord.  The patient presents now for decompressive surgery in hopes of improving his symptoms.  Operative note: After induction of anesthesia, patient position prone onto bolsters and appropriately padded.  Throat lumbar and thoracic region prepped and draped sterilely.  Incision made overlying T10-T11.  Dissection performed bilaterally.  Retractor placed.  X-ray taken.  Level confirmed.  Decompressive laminectomy and then performed using Leksell Rogers high-speed drill and Kerrison Aundria Rud to remove the inferior three quarters of the lamina of T10, the medial aspect of the T10-T11 facet joint complex and the superior one half of the T11 lamina.  Calcified ligamentum flavum was dissected free of the underlying thecal sac and removed.  Lateral gutters were decompressed.  At this point a very thorough decompression of been achieved.  There was no evidence of injury to the thecal sac or nerve roots.  Wound was then irrigated with antibiotic solution.  Gelfoam was placed topically for hemostasis.  Wounds and closed in layers with Vicryl sutures.  Steri-Strips and sterile dressing were applied.  No apparent complications.  Patient tolerated the procedure well and he returns to the recovery room postop.

## 2018-01-13 NOTE — Anesthesia Preprocedure Evaluation (Signed)
Anesthesia Evaluation  Patient identified by MRN, date of birth, ID band Patient awake    Reviewed: Allergy & Precautions, NPO status , Patient's Chart, lab work & pertinent test results  Airway Mallampati: II  TM Distance: >3 FB Neck ROM: Full    Dental  (+) Teeth Intact, Dental Advisory Given   Pulmonary neg pulmonary ROS, Current Smoker,    breath sounds clear to auscultation       Cardiovascular negative cardio ROS   Rhythm:Regular Rate:Normal     Neuro/Psych PSYCHIATRIC DISORDERS Anxiety Depression    GI/Hepatic negative GI ROS, Neg liver ROS,   Endo/Other  negative endocrine ROS  Renal/GU negative Renal ROS     Musculoskeletal negative musculoskeletal ROS (+) Arthritis ,   Abdominal   Peds  Hematology negative hematology ROS (+)   Anesthesia Other Findings   Reproductive/Obstetrics                             Anesthesia Physical  Anesthesia Plan  ASA: II  Anesthesia Plan: General   Post-op Pain Management:    Induction: Intravenous  PONV Risk Score and Plan: 2 and Ondansetron, Dexamethasone and Midazolam  Airway Management Planned: Oral ETT  Additional Equipment: None  Intra-op Plan:   Post-operative Plan: Extubation in OR  Informed Consent: I have reviewed the patients History and Physical, chart, labs and discussed the procedure including the risks, benefits and alternatives for the proposed anesthesia with the patient or authorized representative who has indicated his/her understanding and acceptance.   Dental advisory given  Plan Discussed with: CRNA  Anesthesia Plan Comments:         Anesthesia Quick Evaluation

## 2018-01-13 NOTE — Anesthesia Postprocedure Evaluation (Signed)
Anesthesia Post Note  Patient: Adam Burnett  Procedure(s) Performed: Laminectomy - Thoracic ten-Thoracic eleven (N/A Back)     Patient location during evaluation: PACU Anesthesia Type: General Level of consciousness: sedated and patient cooperative Pain management: pain level controlled Vital Signs Assessment: post-procedure vital signs reviewed and stable Respiratory status: spontaneous breathing Cardiovascular status: stable Anesthetic complications: no    Last Vitals:  Vitals:   01/13/18 1457 01/13/18 1706  BP: (!) 135/94 134/88  Pulse: 90 84  Resp: 20 18  Temp: 36.8 C 36.7 C  SpO2: 99% 99%    Last Pain:  Vitals:   01/13/18 1715  TempSrc:   PainSc: 7                  Lewie Loron

## 2018-01-13 NOTE — H&P (Signed)
Adam Burnett is an 57 y.o. male.   Chief Complaint: Weakness HPI: 57 year old male status post prior L3-S1 decompression and fusion by another physician presents with persistent and worsening bilateral lower extremity weakness consistent with a lower thoracic myelopathy.  Work-up demonstrates evidence of significant multifactorial stenosis at T10-T11.  Patient presents now for decompressive surgery in hopes of improving his symptoms.  Past Medical History:  Diagnosis Date  . Anxiety   . Arthritis    R knee.   . Depression   . Hemorrhoids   . Rectal pain   . Sarcoidosis     Past Surgical History:  Procedure Laterality Date  . ANTERIOR CRUCIATE LIGAMENT REPAIR Right   . ANTERIOR LAT LUMBAR FUSION N/A 01/28/2017   Procedure: Lumbar Three- Four Lumbar Four- Five Anterior interbody fusion via transpsoas approach;  Surgeon: Ditty, Loura Halt, MD;  Location: [facility] OR;  Service: Neurosurgery;  Laterality: N/A;  L3-4 L4-5 Anterior interbody fusion via transpsoas approach/L3-S1 Laminectomy for decompression with posterior column osteotomies/L5-S1 Transforaminal lumbar interbody fusion/L3-S1 Posterior segmental ins  . APPLICATION OF ROBOTIC ASSISTANCE FOR SPINAL PROCEDURE N/A 01/28/2017   Procedure: APPLICATION OF ROBOTIC ASSISTANCE FOR SPINAL PROCEDURE;  Surgeon: Ditty, Loura Halt, MD;  Location: [facility] OR;  Service: Neurosurgery;  Laterality: N/A;  . BACK SURGERY  2007  . CARDIAC CATHETERIZATION     02/2016  . COLONOSCOPY    . HAND TENDON SURGERY Right   . HEMORRHOID SURGERY  2005 - approximate  . SPHINCTEROTOMY  2007  . TRANSFORAMINAL LUMBAR INTERBODY FUSION (TLIF) WITH PEDICLE SCREW FIXATION 3 LEVEL  01/28/2017   Procedure: Lumbar Three-Sacral One Laminectomy for decompression with posterior column osteotomies, Lumbar five-Sacral One Transforaminal lumbar interbody fusion/Lumbar Three-Sacral One Posterior segmental instrumented fusion;  Surgeon: Ditty, Loura Halt, MD;  Location:  [facility] OR;  Service: Neurosurgery;;    Family History  Problem Relation Age of Onset  . Heart disease Mother   . Prostate cancer Father    Social History:  reports that he has been smoking cigarettes. He has been smoking about 1.00 pack per day. He has never used smokeless tobacco. He reports that he drinks alcohol. He reports that he does not use drugs.  Allergies:  Allergies  Allergen Reactions  . Penicillins Hives, Shortness Of Breath, Rash and Other (See Comments)    PATIENT HAS HAD A PCN REACTION WITH IMMEDIATE RASH, FACIAL/TONGUE/THROAT SWELLING, SOB, OR LIGHTHEADEDNESS WITH HYPOTENSION:  #  #  #  YES  #  #  #   Has patient had a PCN reaction causing severe rash involving mucus membranes or skin necrosis: No PATIENT HAS HAD A PCN REACTION THAT REQUIRED HOSPITALIZATION:  #  #  #  YES  #  #  #   Has patient had a PCN reaction occurring within the last 10 years: No If all of the above answers are "NO", then may proceed with Cephalosporin use.     Medications Prior to Admission  Medication Sig Dispense Refill  . bisacodyl (DULCOLAX) 5 MG EC tablet Take 10 mg by mouth daily as needed for moderate constipation.     Marland Kitchen levothyroxine (SYNTHROID, LEVOTHROID) 50 MCG tablet Take 50 mcg by mouth daily before breakfast.    . naproxen sodium (ALEVE) 220 MG tablet Take 440 mg by mouth 2 (two) times daily.     . diphenhydrAMINE HCl, Sleep, (SLEEP AID) 50 MG CAPS Take 50 mg by mouth at bedtime.      No results found for this or  any previous visit (from the past 48 hour(s)). No results found.  Pertinent items noted in HPI and remainder of comprehensive ROS otherwise negative.  Blood pressure (!) 151/91, pulse 94, temperature 98.3 F (36.8 C), resp. rate 18, height 6\' 2"  (1.88 m), weight 82.7 kg, SpO2 95 %.  Patient is awake and alert.  He is oriented and appropriate.  Speech is fluent.  Judgment and insight are intact.  Cranial nerve function normal bilateral.  Motor examination with generalized  weakness in both lower extremities.  Patient with increased tone.  Patient with some spasticity.  Reflexes are mildly increased.  Toes are upgoing to plantar stimulation.  Sensory examination with mild sensory loss from T12 distally.  Examination head ears eyes nose and throat are unremarkable her chest and abdomen are benign.  Extremities are free from injury deformity.    Assessment/Plan T10-T11 stenosis with myelopathy.  Plan T10-T11 decompressive laminectomy.  Risks and benefits been explained.  Patient wishes to proceed.  Kolbee Bogusz A Marvie Brevik 01/13/2018, 10:06 AM

## 2018-01-13 NOTE — Anesthesia Procedure Notes (Signed)
Procedure Name: Intubation Date/Time: 01/13/2018 12:02 PM Performed by: Valda Favia, CRNA Pre-anesthesia Checklist: Patient identified, Emergency Drugs available, Suction available and Patient being monitored Patient Re-evaluated:Patient Re-evaluated prior to induction Oxygen Delivery Method: Circle System Utilized Preoxygenation: Pre-oxygenation with 100% oxygen Induction Type: IV induction Ventilation: Mask ventilation without difficulty and Oral airway inserted - appropriate to patient size Laryngoscope Size: Mac and 4 Grade View: Grade I Tube type: Oral Tube size: 7.5 mm Number of attempts: 1 Airway Equipment and Method: Stylet and Oral airway Placement Confirmation: ETT inserted through vocal cords under direct vision,  positive ETCO2 and breath sounds checked- equal and bilateral Secured at: 23 cm Tube secured with: Tape Dental Injury: Teeth and Oropharynx as per pre-operative assessment

## 2018-01-13 NOTE — Brief Op Note (Signed)
01/13/2018  1:16 PM  PATIENT:  Adam Burnett  57 y.o. male  PRE-OPERATIVE DIAGNOSIS:  Stenosis  POST-OPERATIVE DIAGNOSIS:  Stenosis  PROCEDURE:  Procedure(s): Laminectomy - Thoracic ten-Thoracic eleven (N/A)  SURGEON:  Surgeon(s) and Role:    * Julio Sicks, MD - Primary    * Ostergard, Clovis Pu, MD - Assisting  PHYSICIAN ASSISTANT:   ASSISTANTS:    ANESTHESIA:   general  EBL:  100 cc   BLOOD ADMINISTERED:none  DRAINS: none   LOCAL MEDICATIONS USED:  MARCAINE     SPECIMEN:  No Specimen  DISPOSITION OF SPECIMEN:  N/A  COUNTS:  YES  TOURNIQUET:  * No tourniquets in log *  DICTATION: .Dragon Dictation  PLAN OF CARE: Admit to inpatient   PATIENT DISPOSITION:  PACU - hemodynamically stable.   Delay start of Pharmacological VTE agent (>24hrs) due to surgical blood loss or risk of bleeding: yes

## 2018-01-13 NOTE — Transfer of Care (Signed)
Immediate Anesthesia Transfer of Care Note  Patient: Adam Burnett  Procedure(s) Performed: Laminectomy - Thoracic ten-Thoracic eleven (N/A Back)  Patient Location: PACU  Anesthesia Type:General  Level of Consciousness: awake, alert  and oriented  Airway & Oxygen Therapy: Patient Spontanous Breathing and Patient connected to face mask oxygen  Post-op Assessment: Report given to RN and Post -op Vital signs reviewed and stable  Post vital signs: Reviewed and stable  Last Vitals:  Vitals Value Taken Time  BP 142/82 01/13/2018  1:29 PM  Temp    Pulse 88 01/13/2018  1:35 PM  Resp 15 01/13/2018  1:35 PM  SpO2 94 % 01/13/2018  1:35 PM  Vitals shown include unvalidated device data.  Last Pain:  Vitals:   01/13/18 1329  PainSc: (P) 3       Patients Stated Pain Goal: 4 (01/13/18 1052)  Complications: No apparent anesthesia complications

## 2018-01-13 NOTE — Plan of Care (Signed)
  Problem: Pain Management: Goal: Pain level will decrease Outcome: Progressing   Problem: Bladder/Genitourinary: Goal: Urinary functional status for postoperative course will improve Outcome: Progressing

## 2018-01-14 ENCOUNTER — Encounter (HOSPITAL_COMMUNITY): Payer: Self-pay | Admitting: Neurosurgery

## 2018-01-14 DIAGNOSIS — Z880 Allergy status to penicillin: Secondary | ICD-10-CM | POA: Diagnosis not present

## 2018-01-14 DIAGNOSIS — Z791 Long term (current) use of non-steroidal anti-inflammatories (NSAID): Secondary | ICD-10-CM | POA: Diagnosis not present

## 2018-01-14 DIAGNOSIS — Z981 Arthrodesis status: Secondary | ICD-10-CM | POA: Diagnosis not present

## 2018-01-14 DIAGNOSIS — Z79890 Hormone replacement therapy (postmenopausal): Secondary | ICD-10-CM | POA: Diagnosis not present

## 2018-01-14 DIAGNOSIS — M4714 Other spondylosis with myelopathy, thoracic region: Secondary | ICD-10-CM | POA: Diagnosis not present

## 2018-01-14 DIAGNOSIS — M4804 Spinal stenosis, thoracic region: Secondary | ICD-10-CM | POA: Diagnosis not present

## 2018-01-14 DIAGNOSIS — D869 Sarcoidosis, unspecified: Secondary | ICD-10-CM | POA: Diagnosis not present

## 2018-01-14 DIAGNOSIS — F17210 Nicotine dependence, cigarettes, uncomplicated: Secondary | ICD-10-CM | POA: Diagnosis not present

## 2018-01-14 MED ORDER — HYDROCODONE-ACETAMINOPHEN 5-325 MG PO TABS: 1.0000 | ORAL_TABLET | ORAL | 0 refills | Status: DC | PRN

## 2018-01-14 MED ORDER — CYCLOBENZAPRINE HCL 10 MG PO TABS: 10.0000 mg | ORAL_TABLET | Freq: Three times a day (TID) | ORAL | 0 refills | Status: AC | PRN

## 2018-01-14 NOTE — Discharge Summary (Signed)
Physician Discharge Summary  Patient ID: Adam Burnett MRN: [id number] DOB/AGE: February 20, 1961 57 y.o.  Admit date: 01/13/2018 Discharge date: 01/14/2018  Admission Diagnoses:  Discharge Diagnoses:  Active Problems:   Thoracic spinal stenosis   Discharged Condition: good  Hospital Course: Patient admitted to hospital where he underwent uncomplicated lower thoracic decompressive laminectomy.  Postoperatively doing well.  Preoperative back and lower extremity pain and weakness improved.  Standing and walking better.  Ready for discharge home.  Consults:   Significant Diagnostic Studies:   Treatments:   Discharge Exam: Blood pressure 125/86, pulse 81, temperature 98.1 F (36.7 C), temperature source Oral, resp. rate 19, height 6\' 2"  (1.88 m), weight 82.7 kg, SpO2 100 %. Awake and alert.  Oriented and appropriate.  Cranial nerve function intact.  Motor and sensory function extremities stable.  Wound clean and dry.  Chest and abdomen benign.  Disposition: Discharge disposition: 01-Home or Self Care        Allergies as of 01/14/2018      Reactions   Penicillins Hives, Shortness Of Breath, Rash, Other (See Comments)   PATIENT HAS HAD A PCN REACTION WITH IMMEDIATE RASH, FACIAL/TONGUE/THROAT SWELLING, SOB, OR LIGHTHEADEDNESS WITH HYPOTENSION:  #  #  #  YES  #  #  #   Has patient had a PCN reaction causing severe rash involving mucus membranes or skin necrosis: No PATIENT HAS HAD A PCN REACTION THAT REQUIRED HOSPITALIZATION:  #  #  #  YES  #  #  #   Has patient had a PCN reaction occurring within the last 10 years: No If all of the above answers are "NO", then may proceed with Cephalosporin use.      Medication List    TAKE these medications   bisacodyl 5 MG EC tablet Commonly known as:  DULCOLAX Take 10 mg by mouth daily as needed for moderate constipation.   cyclobenzaprine 10 MG tablet Commonly known as:  FLEXERIL Take 1 tablet (10 mg total) by mouth 3 (three) times  daily as needed for muscle spasms.   HYDROcodone-acetaminophen 5-325 MG tablet Commonly known as:  NORCO/VICODIN Take 1-2 tablets by mouth every 4 (four) hours as needed for moderate pain ((score 4 to 6)).   levothyroxine 50 MCG tablet Commonly known as:  SYNTHROID, LEVOTHROID Take 50 mcg by mouth daily before breakfast.   naproxen sodium 220 MG tablet Commonly known as:  ALEVE Take 440 mg by mouth 2 (two) times daily.   SLEEP AID 50 MG Caps Generic drug:  diphenhydrAMINE HCl (Sleep) Take 50 mg by mouth at bedtime.        Signed: Kathaleen Maser Osha Rane 01/14/2018, 7:59 AM

## 2018-01-14 NOTE — Evaluation (Addendum)
Occupational Therapy Evaluation Patient Details Name: Adam Burnett MRN: [id number] DOB: October 23, 1960 Today's Date: 01/14/2018    History of Present Illness Pt is a 57 y/o male s/p T10-T11 decompressive laminectomy. PMH significant for but not limited to: L3-S1 decompression and fusion, anxiety, R knee arthritis.    Clinical Impression   PTA patient independent and driving, although reports using RW or cane/furniture walking in home due to increasing weakness in B LEs.  Patient currently admitted for above and limited by impaired balance, pain, back precautions and LB weakness.  He currently requires min guard for mobility with no AD and supervision using RW, transfers with supervision using RW, grooming with supervision, UB ADLs with supervision, and LB ADLs with supervision.  Educated on precautions, mobility, posture, safety, use of RW for all mobility and AE as needed, compensatory techniques for ADLs, and recommendations. Pt will have 24/7 support from spouse for first week.  Encouraged follow up with surgeon for OP PT due to reports of B LE weakness and impaired balance, pt agreeable. Based on performance today, no further OT needs identified.  OT signing off.      Follow Up Recommendations  No OT follow up;Supervision - Intermittent    Equipment Recommendations  None recommended by OT    Recommendations for Other Services       Precautions / Restrictions Precautions Precautions: Fall;Back Precaution Booklet Issued: Yes (comment) Precaution Comments: reviewed precautions with pt and spouse  Required Braces or Orthoses: (no brace needed order ) Restrictions Weight Bearing Restrictions: No      Mobility Bed Mobility               General bed mobility comments: seated EOB upon entry  Transfers Overall transfer level: Needs assistance Equipment used: Rolling walker (2 wheeled);None Transfers: Sit to/from Stand Sit to Stand: Min guard;Supervision          General transfer comment: min guard with no AD, supervision using RW     Balance Overall balance assessment: Mild deficits observed, not formally tested(reliant on UE support in standing )                                         ADL either performed or assessed with clinical judgement   ADL Overall ADL's : Needs assistance/impaired     Grooming: Supervision/safety;Standing   Upper Body Bathing: Set up;Sitting   Lower Body Bathing: Set up;Sit to/from stand;Cueing for compensatory techniques;Adhering to back precautions   Upper Body Dressing : Set up;Sitting   Lower Body Dressing: Supervision/safety;Sit to/from stand;Cueing for compensatory techniques;Adhering to back precautions Lower Body Dressing Details (indicate cue type and reason): able to complete figure 4 techniques with cueing, increased time  Toilet Transfer: Min guard;Ambulation Toilet Transfer Details (indicate cue type and reason): min guard for safety, encouarged use of rolling walker during mobility  Toileting- Clothing Manipulation and Hygiene: Supervision/safety;Sit to/from stand;Adhering to back precautions   Tub/ Shower Transfer: Walk-in shower;Supervision/safety;Ambulation;Shower Dealer Details (indicate cue type and reason): educated on reverse step technique with RW  Functional mobility during ADLs: Min guard;Supervision/safety(min guard with no AD, supervision with RW ) General ADL Comments: patient educated on back precautions and compenastory techniques with ADLs, mobilty     Vision Baseline Vision/History: Wears glasses Wears Glasses: At all times Patient Visual Report: No change from baseline Vision Assessment?: No apparent visual deficits  Perception     Praxis      Pertinent Vitals/Pain Pain Assessment: Faces Faces Pain Scale: Hurts a little bit Pain Location: incisional Pain Descriptors / Indicators: Discomfort Pain Intervention(s):  Monitored during session     Hand Dominance Right   Extremity/Trunk Assessment Upper Extremity Assessment Upper Extremity Assessment: Overall WFL for tasks assessed   Lower Extremity Assessment Lower Extremity Assessment: Generalized weakness   Cervical / Trunk Assessment Cervical / Trunk Assessment: Other exceptions Cervical / Trunk Exceptions: s/p spinal sx   Communication Communication Communication: No difficulties   Cognition Arousal/Alertness: Awake/alert Behavior During Therapy: WFL for tasks assessed/performed Overall Cognitive Status: Within Functional Limits for tasks assessed                                     General Comments       Exercises     Shoulder Instructions      Home Living Family/patient expects to be discharged to:: Private residence Living Arrangements: Spouse/significant other Available Help at Discharge: Family;Available 24 hours/day(for 1st week) Type of Home: House Home Access: Stairs to enter [employer] of Steps: 4 Entrance Stairs-Rails: Right;Left;Can reach both Home Layout: One level     Bathroom Shower/Tub: Producer, television/film/video: Standard     Home Equipment: Cane - single point;Shower seat - built in;Walker - 2 wheels;Bedside commode;Adaptive equipment Adaptive Equipment: Reacher        Prior Functioning/Environment Level of Independence: Independent        Comments: independent ADLs, driving, limited IADLs         OT Problem List: Decreased strength;Decreased activity tolerance;Impaired balance (sitting and/or standing);Decreased knowledge of use of DME or AE;Decreased safety awareness;Decreased knowledge of precautions      OT Treatment/Interventions:      OT Goals(Current goals can be found in the care plan section) Acute Rehab OT Goals Patient Stated Goal: to get stronger  OT Goal Formulation: With patient  OT Frequency:     Barriers to D/C:             Co-evaluation              AM-PAC PT "6 Clicks" Daily Activity     Outcome Measure Help from another person eating meals?: None Help from another person taking care of personal grooming?: None Help from another person toileting, which includes using toliet, bedpan, or urinal?: A Little Help from another person bathing (including washing, rinsing, drying)?: None Help from another person to put on and taking off regular upper body clothing?: None Help from another person to put on and taking off regular lower body clothing?: None 6 Click Score: 23   End of Session Equipment Utilized During Treatment: Gait belt;Rolling walker Nurse Communication: Mobility status  Activity Tolerance: Patient tolerated treatment well Patient left: with call bell/phone within reach;with family/visitor present;Other (comment)(seated EOB)  OT Visit Diagnosis: Other abnormalities of gait and mobility (R26.89);Unsteadiness on feet (R26.81);History of falling (Z91.81);Muscle weakness (generalized) (M62.81)                Time: 1478-2956 OT Time Calculation (min): 13 min Charges:  OT General Charges $OT Visit: 1 Visit OT Evaluation $OT Eval Moderate Complexity: 1 Mod  Chancy Milroy, OT Acute Rehabilitation Services Pager [phone] Office [phone]   Chancy Milroy 01/14/2018, 8:36 AM

## 2018-01-14 NOTE — Progress Notes (Signed)
Patient is discharged from room 3C09 at this time. Alert and in stable condition. IV site d/c'd and instructions read to patient and spouse with understanding verbalized. Left unit via wheelchair with all belongings at side. 

## 2018-01-14 NOTE — Discharge Instructions (Signed)

## 2018-01-20 DIAGNOSIS — K5909 Other constipation: Secondary | ICD-10-CM | POA: Diagnosis not present

## 2018-01-20 DIAGNOSIS — K635 Polyp of colon: Secondary | ICD-10-CM | POA: Diagnosis not present

## 2018-01-20 DIAGNOSIS — Z6823 Body mass index (BMI) 23.0-23.9, adult: Secondary | ICD-10-CM | POA: Diagnosis not present

## 2018-01-20 DIAGNOSIS — J208 Acute bronchitis due to other specified organisms: Secondary | ICD-10-CM | POA: Diagnosis not present

## 2018-02-05 DIAGNOSIS — K5900 Constipation, unspecified: Secondary | ICD-10-CM | POA: Diagnosis not present

## 2018-02-19 DIAGNOSIS — K5900 Constipation, unspecified: Secondary | ICD-10-CM | POA: Diagnosis not present

## 2018-04-07 ENCOUNTER — Telehealth: Payer: Self-pay | Admitting: Diagnostic Neuroimaging

## 2018-04-07 NOTE — Telephone Encounter (Signed)
I called pt and relayed that Dr. Marjory LiesPenumalli can see him or [facility].  He would like to come and see Dr. Marjory LiesPenumalli. Made appt 04-11-2018 at 1100.  He verbalized udnerstanding.

## 2018-04-07 NOTE — Telephone Encounter (Signed)
Called patient who stated he had back surgery Nov 2018, and afterwards he had bowel and leg issues. He was referred  to Adam Burnett. After MRI, Adam Burnett did a minor back surgery. He stated for the last 4 months he's been taking stool softener and laxative. He has seen a GI Adam and was told he doesn't have constipation, but his sphincter muscle is tightening up. He stated his leg muscles are spasming,  balance is off and feet are worse with numbness in both feet. Penis is numb which began after last surgery, and he can't control urine, can't hold it very long at all.  He saw Adam Burnett 5 weeks ago and was told it may take time to improve, and he may need to see Adam Burnett. Patient stated his symptoms are getting worse, and he wants to see Adam Burnett.  I advised will discuss with Adam Burnett and call him. He  verbalized understanding, appreciation.

## 2018-04-07 NOTE — Telephone Encounter (Signed)
Patient called and stated that he is experiencing issues with his bowels and legs and needs to be seen soon. Please call and advise.

## 2018-04-07 NOTE — Telephone Encounter (Signed)
I can see patient. Please request notes from dr. Jordan Likes. [facility]. -VRP

## 2018-04-11 ENCOUNTER — Encounter: Payer: Self-pay | Admitting: Diagnostic Neuroimaging

## 2018-04-11 ENCOUNTER — Ambulatory Visit (INDEPENDENT_AMBULATORY_CARE_PROVIDER_SITE_OTHER): Payer: BLUE CROSS/BLUE SHIELD | Admitting: Diagnostic Neuroimaging

## 2018-04-11 VITALS — BP 133/79 | HR 95 | Ht 74.5 in | Wt 175.2 lb

## 2018-04-11 DIAGNOSIS — R29898 Other symptoms and signs involving the musculoskeletal system: Secondary | ICD-10-CM | POA: Diagnosis not present

## 2018-04-11 NOTE — Progress Notes (Signed)
[facility]  PATIENT: Adam Burnett DOB: Oct 31, 1960  REFERRING CLINICIAN: H Pool HISTORY FROM: patient and wife  REASON FOR VISIT: follow up   HISTORICAL  CHIEF COMPLAINT:  Chief Complaint  Patient presents with  . Myelopathy    rm 7, "not much berter after surgery T10-T 11, Oct 2019"    HISTORY OF PRESENT ILLNESS:   UPDATE (04/11/18, VRP): Since last visit, had T10-11 decompression surgery. Since then, spasms in legs have improved. Bowel function and genital numbness have continued to worsened after surgery. Gait is still poor. Symptoms are progressive. Severity is increasing. No alleviating or aggravating factors.    PRIOR HPI: 58 year old male here for evaluation of lower extremity numbness and pain.  In 2010 patient was having low back pain rating to left leg.  He was diagnosed with disk bulging and bone spurs and treated with lumbar spine surgery.  Symptoms resolved.  In 2016 symptoms returned now radiating from his left lower back into his left inner thigh and leg down to his foot.  He underwent conservative management and then ultimately a second lumbar spine surgery in November 2018.  Patient underwent rehabilitation and pain management for several weeks.  In January or February 2019 patient noticed that he was developing muscle spasms, cramps, numbness and tingling in his feet and legs.  His balance progressively worsened.  He developed constipation and perineal anesthesia/numbness.  Symptoms progressively worsened until June 2019, and had a follow-up in spine surgery clinic.  MRI of the lumbar spine showed stable postoperative findings and EMG nerve conduction study was unremarkable.  However due to progressive symptoms, concern for possible small fiber neuropathy was raised and patient was referred here for evaluation.  Today patient has significant spastic unsteady gait.  He is barely able to walk using a cane.  He is having significant problems  using the bathroom.  Symptoms have worsened in the last 4 to 6 weeks.  No problems with his fingers or hands.  No problems with speech or swallowing.  No vision changes.  Patient has history of pulmonary sarcoidosis diagnosed in 1991, previously on prednisone, now in remission.   REVIEW OF SYSTEMS: Full 14 system review of systems performed and negative with exception of: Weight loss fatigue blurred vision shortness of breath aching muscles decreased energy weakness urination problems impotence.   ALLERGIES: Allergies  Allergen Reactions  . Penicillins Hives, Shortness Of Breath, Rash and Other (See Comments)    PATIENT HAS HAD A PCN REACTION WITH IMMEDIATE RASH, FACIAL/TONGUE/THROAT SWELLING, SOB, OR LIGHTHEADEDNESS WITH HYPOTENSION:  #  #  #  YES  #  #  #   Has patient had a PCN reaction causing severe rash involving mucus membranes or skin necrosis: No PATIENT HAS HAD A PCN REACTION THAT REQUIRED HOSPITALIZATION:  #  #  #  YES  #  #  #   Has patient had a PCN reaction occurring within the last 10 years: No If all of the above answers are "NO", then may proceed with Cephalosporin use.     HOME MEDICATIONS: Outpatient Medications Prior to Visit  Medication Sig Dispense Refill  . diphenhydrAMINE HCl, Sleep, (SLEEP AID) 50 MG CAPS Take 50 mg by mouth at bedtime.    Marland Kitchen levothyroxine (SYNTHROID, LEVOTHROID) 50 MCG tablet Take 50 mcg by mouth daily before breakfast.    . naproxen sodium (ALEVE) 220 MG tablet Take 440 mg by mouth 2 (two) times daily.     Marland Kitchen OVER THE  COUNTER MEDICATION ducolax suppository as needed    . polyethylene glycol (MIRALAX / GLYCOLAX) packet Take 17 g by mouth daily.    . Wheat Dextrin (BENEFIBER PO) Take by mouth.    . bisacodyl (DULCOLAX) 5 MG EC tablet Take 10 mg by mouth daily as needed for moderate constipation.     . cyclobenzaprine (FLEXERIL) 10 MG tablet Take 1 tablet (10 mg total) by mouth 3 (three) times daily as needed for muscle spasms. (Patient not taking:  Reported on 04/07/2018) 30 tablet 0  . HYDROcodone-acetaminophen (NORCO/VICODIN) 5-325 MG tablet Take 1-2 tablets by mouth every 4 (four) hours as needed for moderate pain ((score 4 to 6)). (Patient not taking: Reported on 04/07/2018) 50 tablet 0   No facility-administered medications prior to visit.     PAST MEDICAL HISTORY: Past Medical History:  Diagnosis Date  . Anxiety   . Arthritis    R knee.   . Depression   . Hemorrhoids   . Rectal pain   . Sarcoidosis     PAST SURGICAL HISTORY: Past Surgical History:  Procedure Laterality Date  . ANTERIOR CRUCIATE LIGAMENT REPAIR Right   . ANTERIOR LAT LUMBAR FUSION N/A 01/28/2017   Procedure: Lumbar Three- Four Lumbar Four- Five Anterior interbody fusion via transpsoas approach;  Surgeon: Ditty, Loura Halt, MD;  Location: [facility] OR;  Service: Neurosurgery;  Laterality: N/A;  L3-4 L4-5 Anterior interbody fusion via transpsoas approach/L3-S1 Laminectomy for decompression with posterior column osteotomies/L5-S1 Transforaminal lumbar interbody fusion/L3-S1 Posterior segmental ins  . APPLICATION OF ROBOTIC ASSISTANCE FOR SPINAL PROCEDURE N/A 01/28/2017   Procedure: APPLICATION OF ROBOTIC ASSISTANCE FOR SPINAL PROCEDURE;  Surgeon: Ditty, Loura Halt, MD;  Location[facility] OR;  Service: Neurosurgery;  Laterality: N/A;  . BACK SURGERY  2007  . CARDIAC CATHETERIZATION     02/2016  . COLONOSCOPY    . HAND TENDON SURGERY Right   . HEMORRHOID SURGERY  2005 - approximate  . SPHINCTEROTOMY  2007  . THORACIC DISCECTOMY N/A 01/13/2018   Procedure: Laminectomy - Thoracic ten-Thoracic eleven;  Surgeon: Julio Sicks, MD;  Location: [facility] OR;  Service: Neurosurgery;  Laterality: N/A;  . TRANSFORAMINAL LUMBAR INTERBODY FUSION (TLIF) WITH PEDICLE SCREW FIXATION 3 LEVEL  01/28/2017   Procedure: Lumbar Three-Sacral One Laminectomy for decompression with posterior column osteotomies, Lumbar five-Sacral One Transforaminal lumbar interbody fusion/Lumbar Three-Sacral One  Posterior segmental instrumented fusion;  Surgeon: Ditty, Loura Halt, MD;  Location: [facility] OR;  Service: Neurosurgery;;    FAMILY HISTORY: Family History  Problem Relation Age of Onset  . Heart disease Mother   . Prostate cancer Father     SOCIAL HISTORY: Social History   Socioeconomic History  . Marital status: Married    Spouse name: [name]  . Number of children: 1  . Years of education: 29  . Highest education level: Not on file  Occupational History    Comment: NA  Social Needs  . Financial resource strain: Not on file  . Food insecurity:    Worry: Not on file    Inability: Not on file  . Transportation needs:    Medical: Not on file    Non-medical: Not on file  Tobacco Use  . Smoking status: Current Every Day Smoker    Packs/day: 1.00    Types: Cigarettes  . Smokeless tobacco: Never Used  Substance and Sexual Activity  . Alcohol use: Yes    Comment: rare  . Drug use: No  . Sexual activity: Not on file  Lifestyle  .  Physical activity:    Days per week: Not on file    Minutes per session: Not on file  . Stress: Not on file  Relationships  . Social connections:    Talks on phone: Not on file    Gets together: Not on file    Attends religious service: Not on file    Active member of club or organization: Not on file    Attends meetings of clubs or organizations: Not on file    Relationship status: Not on file  . Intimate partner violence:    Fear of current or ex partner: Not on file    Emotionally abused: Not on file    Physically abused: Not on file    Forced sexual activity: Not on file  Other Topics Concern  . Not on file  Social History Narrative   Lives wife, Magda Paganiniudrey at home.  Cannot work.  Education HS.  [employer].     Caffeine one cup daily.     PHYSICAL EXAM  GENERAL EXAM/CONSTITUTIONAL: Vitals:  Vitals:   04/11/18 1100  BP: 133/79  Pulse: 95  Weight: 175 lb 3.2 oz (79.5 kg)  Height: 6' 2.5" (1.892 m)   Body mass index is 22.19  kg/m. Wt Readings from Last 3 Encounters:  04/11/18 175 lb 3.2 oz (79.5 kg)  01/13/18 182 lb 6.4 oz (82.7 kg)  01/03/18 182 lb 6.4 oz (82.7 kg)    Patient is in no distress; well developed, nourished and groomed; neck is supple  CARDIOVASCULAR:  Examination of carotid arteries is normal; no carotid bruits  Regular rate and rhythm, no murmurs  Examination of peripheral vascular system by observation and palpation is normal  EYES:  Ophthalmoscopic exam of optic discs and posterior segments is normal; no papilledema or hemorrhages  Visual Acuity Screening   Right eye Left eye Both eyes  Without correction:     With correction: 20/50 20/40   Comments: bifocals   MUSCULOSKELETAL:  Gait, strength, tone, movements noted in Neurologic exam below  NEUROLOGIC: MENTAL STATUS:  No flowsheet data found.  awake, alert, oriented to person, place and time  recent and remote memory intact  normal attention and concentration  language fluent, comprehension intact, naming intact  fund of knowledge appropriate  CRANIAL NERVE:   2nd - no papilledema on fundoscopic exam  2nd, 3rd, 4th, 6th - pupils equal and reactive to light, visual fields full to confrontation, extraocular muscles intact, no nystagmus  5th - facial sensation symmetric  7th - facial strength symmetric  8th - hearing intact  9th - palate elevates symmetrically, uvula midline  11th - shoulder shrug symmetric  12th - tongue protrusion midline  MOTOR:   normal bulk and tone, full strength in the BUE  INCREASED TONE IN BLE --> HF 3, KE 4, KF 4, DF 3  CLONUS IN RIGHT > LEFT ANKLES  SENSORY:   normal and symmetric to light touch; DECR IN BLE   COORDINATION:   finger-nose-finger, fine finger movements --> normal in BUE  REFLEXES:   deep tendon reflexes --> BUE 2; KNEES 3 (POSITIVE SUPRAPATELLAR AND CROSSED ADDUCTORS); ANKLES 3  GAIT/STATION:   SPASTIC, UNSTEADY GAIT; NARROW SCISSORING  GAIT     DIAGNOSTIC DATA (LABS, IMAGING, TESTING) - I reviewed patient records, labs, notes, testing and imaging myself where available.  Lab Results  Component Value Date   WBC 8.8 01/03/2018   HGB 15.0 01/03/2018   HCT 46.9 01/03/2018   MCV 94.6 01/03/2018  PLT 457 (H) 01/03/2018      Component Value Date/Time   NA 139 01/03/2018 0908   NA 143 11/19/2017 1632   K 4.3 01/03/2018 0908   CL 106 01/03/2018 0908   CO2 24 01/03/2018 0908   GLUCOSE 100 (H) 01/03/2018 0908   BUN 10 01/03/2018 0908   BUN 13 11/19/2017 1632   CREATININE 0.89 01/03/2018 0908   CALCIUM 9.6 01/03/2018 0908   PROT 7.6 11/19/2017 1632   ALBUMIN 4.9 11/19/2017 1632   AST 15 11/19/2017 1632   ALT 9 11/19/2017 1632   ALKPHOS 77 11/19/2017 1632   BILITOT 0.3 11/19/2017 1632   GFRNONAA >60 01/03/2018 0908   GFRAA >60 01/03/2018 0908   No results found for: CHOL, HDL, LDLCALC, LDLDIRECT, TRIG, CHOLHDL Lab Results  Component Value Date   HGBA1C 5.6 11/19/2017   Lab Results  Component Value Date   VITAMINB12 670 11/19/2017   Lab Results  Component Value Date   TSH 5.240 (H) 11/19/2017     12/31/16 CT THORACIC SPINE [I reviewed images myself and agree with interpretation. In addition, there is moderate spinal stenosis at T7, mild spinal stenosis at T7-8, T8-9 levels and moderate-severe spinal stenosis at T10-11 level. -VRP]  - No fracture or focal lesion. Minimal curvature. No significant disc finding. Thoracic facet arthritis from T4-5 through T11-12.  12/31/16 CT LUMBAR SPINE [I reviewed images myself and agree with interpretation. Also large anterior bone bridging at L3-4.-VRP]  - Previous left hemilaminectomy at L4-5 and L5-S1. - L3-4 disc protrusion more prominent towards the left with stenosis of both lateral recesses left more than right. - L4-5 lateral recess and foraminal narrowing. - L5-S1 lateral recess and foraminal narrowing.   ASSESSMENT AND PLAN  58 y.o. year old male here  with history of lumbar spine surgery x2 in 2010 in 2018, now with progressive lower extremity numbness, weakness, spastic gait, hyperreflexia, incontinence, perineal anesthesia.  Signs and symptoms concerning for thoracic myelopathy.  S/p T10-11 decompression. Sxs continuing to worsen.   Localization: thoracic spinal cord, cauda equina, cervical cord, brain  Dx:  1. Weakness of both lower extremities      PLAN:  BOWEL DYSFUNCTION / GENITAL NUMBNESS (h/o thoracic myelopathy; s/p decompression) - continue current treatments - check MRI brain, cervical and lumbar spine (rule out neurosarcoidosis, demyelinating or compressive disease)  Orders Placed This Encounter  Procedures  . MR BRAIN W WO CONTRAST  . MR CERVICAL SPINE W WO CONTRAST  . MR Lumbar Spine W Wo Contrast   Return in about 3 months (around 07/10/2018).    Suanne MarkerVIKRAM R. Anneta Rounds, MD 04/11/2018, 11:11 AM Certified in Neurology, Neurophysiology and Neuroimaging  [facility] [address], [address] [place], [place] [address] [phone]

## 2018-04-15 ENCOUNTER — Telehealth: Payer: Self-pay | Admitting: Diagnostic Neuroimaging

## 2018-04-15 NOTE — Telephone Encounter (Signed)
[employer] Auth: [id number] (exp. 04/15/18 to 05/14/18) lvm for pt to call back about scheduling mri

## 2018-04-17 DIAGNOSIS — M4804 Spinal stenosis, thoracic region: Secondary | ICD-10-CM | POA: Diagnosis not present

## 2018-04-23 NOTE — Telephone Encounter (Signed)
Pt returned Emily's call °

## 2018-04-23 NOTE — Telephone Encounter (Addendum)
Spoke with patient and advised him of Dr Richrd Humbles reply. He stated that sounded reasonable, but he wanted to know why thoracic spine is not being imaged. He stated that is where his surgery was, and he feels it may need to be checked.  I advised will discuss with Dr Marjory Lies and let him know. He stated after that please have Irving Burton call him to schedule the MRI's.

## 2018-04-23 NOTE — Telephone Encounter (Signed)
I spoke to the patient and he informed me he is really confused on why the MRI brain was order.. Please advise.

## 2018-04-23 NOTE — Telephone Encounter (Signed)
MC, can you please let patient know: - check MRI brain, cervical and lumbar spine (rule out neurosarcoidosis, demyelinating or compressive disease)  -VRP

## 2018-04-24 NOTE — Telephone Encounter (Signed)
Called patient and advised him of Dr [employer] message. He stated he saw Dr Dutch Quint after last seeing Dr Marjory Lies and was told he was released, nothing further could be offered.  I advised will wait ot see what his MRIs show, and there's always second opinion[facility]. He then asked to schedule MRIs. Irving Burton was skyped, and on phone, so she said she'd call him. I told patient. He verbalized understanding, appreciation.

## 2018-04-24 NOTE — Telephone Encounter (Signed)
I spoke to the patient he is scheduled for 04/30/18 at [facility].

## 2018-04-24 NOTE — Telephone Encounter (Signed)
We are looking for other areas that could be causing his symptoms. I will defer further mgmt of his t-spine issues to neurosurgery. -VRP

## 2018-04-30 ENCOUNTER — Ambulatory Visit: Payer: BLUE CROSS/BLUE SHIELD

## 2018-04-30 ENCOUNTER — Other Ambulatory Visit: Payer: Self-pay | Admitting: Diagnostic Neuroimaging

## 2018-04-30 DIAGNOSIS — R29898 Other symptoms and signs involving the musculoskeletal system: Secondary | ICD-10-CM

## 2018-04-30 DIAGNOSIS — G959 Disease of spinal cord, unspecified: Secondary | ICD-10-CM

## 2018-05-01 NOTE — Progress Notes (Unsigned)
Will refer to neurosurgery. -VRP

## 2018-05-05 ENCOUNTER — Telehealth: Payer: Self-pay | Admitting: *Deleted

## 2018-05-05 NOTE — Telephone Encounter (Signed)
-----   Message from Suanne Marker, MD sent at 05/01/2018  5:19 PM EST ----- Normal brain MRI.

## 2018-05-05 NOTE — Telephone Encounter (Signed)
Spoke to pt to confirm to continue with plan of MRI lumbar.  He will see [place] Wednesday.

## 2018-05-05 NOTE — Telephone Encounter (Signed)
-----   Message from Suanne Marker, MD sent at 05/01/2018  5:24 PM EST ----- MRI cervical show spinal cord compression. Will refer back to neurosurgery for evaluation. -VRP

## 2018-05-05 NOTE — Telephone Encounter (Signed)
Spoke with pt and relayed that MRI brain was normal study per Dr. Marjory Lies,  MRI cervical showed some spinal cord compression.   Will refer back to neurosurgery for evaluation per Dr. Marjory Lies.  Pt has MRI Lumbar scheduled for this Wednesday.  I relayed to proceed with MRI lumbar unless per Dr. Marjory Lies not needed and then  I would call him back.   He verbalized understanding.  He would like to have appt to discuss all results prior to be referred back to Dr. Dutch Quint.  I relayed that referral placed on 05-01-18.  Will relay to Dr. Marjory Lies and let him know.

## 2018-05-05 NOTE — Telephone Encounter (Signed)
Go ahead with MRI lumbar as well. VRP

## 2018-05-07 ENCOUNTER — Ambulatory Visit (INDEPENDENT_AMBULATORY_CARE_PROVIDER_SITE_OTHER): Payer: BLUE CROSS/BLUE SHIELD

## 2018-05-07 ENCOUNTER — Other Ambulatory Visit: Payer: BLUE CROSS/BLUE SHIELD

## 2018-05-07 DIAGNOSIS — R29898 Other symptoms and signs involving the musculoskeletal system: Secondary | ICD-10-CM

## 2018-05-07 MED ORDER — GADOBENATE DIMEGLUMINE 529 MG/ML IV SOLN
15.0000 mL | Freq: Once | INTRAVENOUS | Status: AC | PRN
  Administered 2018-05-07: 15 mL via INTRAVENOUS

## 2018-05-12 ENCOUNTER — Telehealth: Payer: Self-pay | Admitting: *Deleted

## 2018-05-12 NOTE — Telephone Encounter (Signed)
-----   Message from Suanne Marker, MD sent at 05/09/2018  3:36 PM EST ----- Stable imaging results. Please call patient. Continue current plan. -VRP

## 2018-05-12 NOTE — Telephone Encounter (Signed)
Dr. Marjory Lies spoke to pt and relayed MRI lumbar results.

## 2018-05-12 NOTE — Telephone Encounter (Signed)
Spoke with pt 05-05-18, and relayed that MRI brain was normal study per Dr. Marjory Lies,  MRI cervical showed some spinal cord compression.   Will refer back to neurosurgery for evaluation per Dr. Marjory Lies.    He verbalized understanding.  He would like to have appt to discuss all results prior to be referred back to Dr. Dutch Quint.  I relayed that referral placed on 05-01-18.  Will relay to Dr. Marjory Lies and let him know.  Stable MRI lumbar per Dr. Marjory Lies, and as per pt request wanted a call to discuss all results prior to referral back to Dr. Dutch Quint.

## 2018-05-12 NOTE — Telephone Encounter (Signed)
-----   Message from Vikram R Penumalli, MD sent at 05/09/2018  3:36 PM EST ----- Stable imaging results. Please call patient. Continue current plan. -VRP 

## 2018-05-12 NOTE — Telephone Encounter (Signed)
I reviewed imaging results with patient by phone. I recommend evaluation with Dr. Jordan Likes for cervical myelopathy. Lumbar spine is stable.   Suanne Marker, MD 05/12/2018, 1:41 PM Certified in Neurology, Neurophysiology and Neuroimaging  [facility] [address] [place], [place] [address] [phone]

## 2018-07-01 ENCOUNTER — Telehealth: Payer: Self-pay | Admitting: Diagnostic Neuroimaging

## 2018-07-01 NOTE — Telephone Encounter (Signed)
Called patient and advised him Dr Marjory Lies recommends he follow up with neurosurgery for cervical spinal cord issues then can follow up in our office. The patient stated he has not contacted Dr Ethelene Browns office due to Covid 19; he cannot go out. I advised they may do video or telephone visits. He stated he wasn't sure he even wanted another surgery and cannot afford that now. I advised Dr Dutch Quint may not recommend surgery, but he could discuss options with dr. He stated he would call, asked his FU be cancelled in our office. He stated he would call back after he sees Dr Dutch Quint. He  verbalized understanding, appreciation.

## 2018-07-01 NOTE — Telephone Encounter (Signed)
I recommend he follow up with NSGY for cervical spinal cord issue. Does not need follow up here until he sees NSGY.  -VRP

## 2018-07-01 NOTE — Telephone Encounter (Signed)
07/01/18 - Spoke to pt to set up virtual video visit and patient stated he was unable due to the following information. Patient states during his last visit with Dr. Marjory Lies, he was referred back to his surgeon. Patient states due to the COVID-19 outbreak, he has been unable to have a visit with his surgeon Dr. Marjory Lies sent him to. Patient states his symptoms are worsening and he is getting tingling and numbness in hands. Patient is unsure of when he will be able to see his surgeon due to COVID-19. Patient is asking for medical advice on his situation due to current circumstances. Please call patient.

## 2018-07-02 DIAGNOSIS — Z0289 Encounter for other administrative examinations: Secondary | ICD-10-CM

## 2018-07-16 ENCOUNTER — Ambulatory Visit: Payer: BLUE CROSS/BLUE SHIELD | Admitting: Diagnostic Neuroimaging

## 2018-07-21 DIAGNOSIS — Z1331 Encounter for screening for depression: Secondary | ICD-10-CM | POA: Diagnosis not present

## 2018-07-21 DIAGNOSIS — R262 Difficulty in walking, not elsewhere classified: Secondary | ICD-10-CM | POA: Diagnosis not present

## 2018-07-21 DIAGNOSIS — G5793 Unspecified mononeuropathy of bilateral lower limbs: Secondary | ICD-10-CM | POA: Diagnosis not present

## 2018-07-21 DIAGNOSIS — Z0000 Encounter for general adult medical examination without abnormal findings: Secondary | ICD-10-CM | POA: Diagnosis not present

## 2018-07-21 DIAGNOSIS — F17200 Nicotine dependence, unspecified, uncomplicated: Secondary | ICD-10-CM | POA: Diagnosis not present

## 2018-07-21 DIAGNOSIS — E039 Hypothyroidism, unspecified: Secondary | ICD-10-CM | POA: Diagnosis not present

## 2018-07-28 DIAGNOSIS — R2689 Other abnormalities of gait and mobility: Secondary | ICD-10-CM | POA: Diagnosis not present

## 2018-07-28 DIAGNOSIS — M6281 Muscle weakness (generalized): Secondary | ICD-10-CM | POA: Diagnosis not present

## 2018-07-31 DIAGNOSIS — M6281 Muscle weakness (generalized): Secondary | ICD-10-CM | POA: Diagnosis not present

## 2018-07-31 DIAGNOSIS — R2689 Other abnormalities of gait and mobility: Secondary | ICD-10-CM | POA: Diagnosis not present

## 2018-08-06 DIAGNOSIS — R030 Elevated blood-pressure reading, without diagnosis of hypertension: Secondary | ICD-10-CM | POA: Diagnosis not present

## 2018-08-06 DIAGNOSIS — G959 Disease of spinal cord, unspecified: Secondary | ICD-10-CM | POA: Diagnosis not present

## 2018-08-11 HISTORY — PX: CERVICAL SPINE SURGERY: SHX589

## 2018-09-01 DIAGNOSIS — Z1159 Encounter for screening for other viral diseases: Secondary | ICD-10-CM | POA: Diagnosis not present

## 2018-09-05 DIAGNOSIS — M4802 Spinal stenosis, cervical region: Secondary | ICD-10-CM | POA: Diagnosis not present

## 2018-10-08 DIAGNOSIS — G959 Disease of spinal cord, unspecified: Secondary | ICD-10-CM | POA: Diagnosis not present

## 2019-07-17 DIAGNOSIS — Z1211 Encounter for screening for malignant neoplasm of colon: Secondary | ICD-10-CM | POA: Diagnosis not present

## 2019-07-17 DIAGNOSIS — H6121 Impacted cerumen, right ear: Secondary | ICD-10-CM | POA: Diagnosis not present

## 2019-07-17 DIAGNOSIS — Z13220 Encounter for screening for lipoid disorders: Secondary | ICD-10-CM | POA: Diagnosis not present

## 2019-07-17 DIAGNOSIS — Z125 Encounter for screening for malignant neoplasm of prostate: Secondary | ICD-10-CM | POA: Diagnosis not present

## 2019-07-17 DIAGNOSIS — H9121 Sudden idiopathic hearing loss, right ear: Secondary | ICD-10-CM | POA: Diagnosis not present

## 2019-07-17 DIAGNOSIS — M25551 Pain in right hip: Secondary | ICD-10-CM | POA: Diagnosis not present

## 2019-07-17 DIAGNOSIS — N529 Male erectile dysfunction, unspecified: Secondary | ICD-10-CM | POA: Diagnosis not present

## 2019-07-17 DIAGNOSIS — E039 Hypothyroidism, unspecified: Secondary | ICD-10-CM | POA: Diagnosis not present

## 2019-07-17 DIAGNOSIS — H9311 Tinnitus, right ear: Secondary | ICD-10-CM | POA: Diagnosis not present

## 2019-07-29 DIAGNOSIS — M542 Cervicalgia: Secondary | ICD-10-CM | POA: Diagnosis not present

## 2019-07-29 DIAGNOSIS — M546 Pain in thoracic spine: Secondary | ICD-10-CM | POA: Diagnosis not present

## 2019-07-29 DIAGNOSIS — G959 Disease of spinal cord, unspecified: Secondary | ICD-10-CM | POA: Diagnosis not present

## 2019-08-26 DIAGNOSIS — G959 Disease of spinal cord, unspecified: Secondary | ICD-10-CM | POA: Diagnosis not present

## 2019-09-09 ENCOUNTER — Encounter: Payer: Self-pay | Admitting: *Deleted

## 2019-09-15 ENCOUNTER — Encounter: Payer: Self-pay | Admitting: Diagnostic Neuroimaging

## 2019-09-15 ENCOUNTER — Ambulatory Visit (INDEPENDENT_AMBULATORY_CARE_PROVIDER_SITE_OTHER): Payer: PPO | Admitting: Diagnostic Neuroimaging

## 2019-09-15 VITALS — BP 139/91 | HR 94 | Ht 74.0 in | Wt 170.6 lb

## 2019-09-15 DIAGNOSIS — R29898 Other symptoms and signs involving the musculoskeletal system: Secondary | ICD-10-CM

## 2019-09-15 DIAGNOSIS — R200 Anesthesia of skin: Secondary | ICD-10-CM

## 2019-09-15 DIAGNOSIS — G959 Disease of spinal cord, unspecified: Secondary | ICD-10-CM | POA: Diagnosis not present

## 2019-09-15 DIAGNOSIS — M4714 Other spondylosis with myelopathy, thoracic region: Secondary | ICD-10-CM | POA: Diagnosis not present

## 2019-09-15 NOTE — Patient Instructions (Signed)
LOWER EXTREMITY WEAKNESS  - continue supportive care; pain mgmt, PT, rollator

## 2019-09-15 NOTE — Progress Notes (Signed)
[facility]  PATIENT: Adam Burnett DOB: [birth year]  REFERRING CLINICIAN: Julio Sicks, MD HISTORY FROM: patient REASON FOR VISIT: follow up   HISTORICAL  CHIEF COMPLAINT:  Chief Complaint  Patient presents with  . Disease of spinal cord    rm 6, "leg weakness progressing, feet and legs swell; balance really bad, falling until I got rollator""    HISTORY OF PRESENT ILLNESS:   UPDATE (09/15/19, VRP): Since last visit, doing poorly. Had ACDF C6-7 in June 2020. Symptoms are stable. Still with pain and lower ext weakness. Bowel / bladder dysfunction continues. No alleviating or aggravating factors. Tolerating meds.    UPDATE (04/11/18, VRP): Since last visit, had T10-11 decompression surgery. Since then, spasms in legs have improved. Bowel function and genital numbness have continued to worsened after surgery. Gait is still poor. Symptoms are progressive. Severity is increasing. No alleviating or aggravating factors.    PRIOR HPI: 59 year old male here for evaluation of lower extremity numbness and pain.  In 2010 patient was having low back pain rating to left leg.  He was diagnosed with disk bulging and bone spurs and treated with lumbar spine surgery.  Symptoms resolved.  In 2016 symptoms returned now radiating from his left lower back into his left inner thigh and leg down to his foot.  He underwent conservative management and then ultimately a second lumbar spine surgery in November 2018.  Patient underwent rehabilitation and pain management for several weeks.  In January or February 2019 patient noticed that he was developing muscle spasms, cramps, numbness and tingling in his feet and legs.  His balance progressively worsened.  He developed constipation and perineal anesthesia/numbness.  Symptoms progressively worsened until June 2019, and had a follow-up in spine surgery clinic.  MRI of the lumbar spine showed stable postoperative findings and EMG nerve  conduction study was unremarkable.  However due to progressive symptoms, concern for possible small fiber neuropathy was raised and patient was referred here for evaluation.  Today patient has significant spastic unsteady gait.  He is barely able to walk using a cane.  He is having significant problems using the bathroom.  Symptoms have worsened in the last 4 to 6 weeks.  No problems with his fingers or hands.  No problems with speech or swallowing.  No vision changes.  Patient has history of pulmonary sarcoidosis diagnosed in 1991, previously on prednisone, now in remission.   REVIEW OF SYSTEMS: Full 14 system review of systems performed and negative with exception of: as per HPI.    ALLERGIES: Allergies  Allergen Reactions  . Penicillins Hives, Shortness Of Breath, Rash and Other (See Comments)    PATIENT HAS HAD A PCN REACTION WITH IMMEDIATE RASH, FACIAL/TONGUE/THROAT SWELLING, SOB, OR LIGHTHEADEDNESS WITH HYPOTENSION:  #  #  #  YES  #  #  #   Has patient had a PCN reaction causing severe rash involving mucus membranes or skin necrosis: No PATIENT HAS HAD A PCN REACTION THAT REQUIRED HOSPITALIZATION:  #  #  #  YES  #  #  #   Has patient had a PCN reaction occurring within the last 10 years: No If all of the above answers are "NO", then may proceed with Cephalosporin use.     HOME MEDICATIONS: Outpatient Medications Prior to Visit  Medication Sig Dispense Refill  . bisacodyl (DULCOLAX) 5 MG EC tablet Take 10 mg by mouth daily as needed for moderate constipation.     . diphenhydrAMINE HCl, Sleep, (  SLEEP AID) 50 MG CAPS Take 50 mg by mouth at bedtime.    . naproxen sodium (ALEVE) 220 MG tablet Take 440 mg by mouth 2 (two) times daily.     Marland Kitchen OVER THE COUNTER MEDICATION ducolax suppository as needed    . polyethylene glycol (MIRALAX / GLYCOLAX) packet Take 17 g by mouth daily.    . Wheat Dextrin (BENEFIBER PO) Take by mouth.    . cyclobenzaprine (FLEXERIL) 10 MG tablet Take 1 tablet (10  mg total) by mouth 3 (three) times daily as needed for muscle spasms. (Patient not taking: Reported on 04/07/2018) 30 tablet 0  . levothyroxine (SYNTHROID, LEVOTHROID) 50 MCG tablet Take 50 mcg by mouth daily before breakfast. (Patient not taking: Reported on 09/15/2019)    . HYDROcodone-acetaminophen (NORCO/VICODIN) 5-325 MG tablet Take 1-2 tablets by mouth every 4 (four) hours as needed for moderate pain ((score 4 to 6)). (Patient not taking: Reported on 04/07/2018) 50 tablet 0   No facility-administered medications prior to visit.    PAST MEDICAL HISTORY: Past Medical History:  Diagnosis Date  . Anxiety   . Arthritis    R knee.   . Depression   . Disease of spinal cord, unspecified (HCC)   . Hemorrhoids   . Rectal pain   . Sarcoidosis     PAST SURGICAL HISTORY: Past Surgical History:  Procedure Laterality Date  . ANTERIOR CRUCIATE LIGAMENT REPAIR Right   . ANTERIOR LAT LUMBAR FUSION N/A 01/28/2017   Procedure: Lumbar Three- Four Lumbar Four- Five Anterior interbody fusion via transpsoas approach;  Surgeon: Ditty, Loura Halt, MD;  Location: [facility] OR;  Service: Neurosurgery;  Laterality: N/A;  L3-4 L4-5 Anterior interbody fusion via transpsoas approach/L3-S1 Laminectomy for decompression with posterior column osteotomies/L5-S1 Transforaminal lumbar interbody fusion/L3-S1 Posterior segmental ins  . APPLICATION OF ROBOTIC ASSISTANCE FOR SPINAL PROCEDURE N/A 01/28/2017   Procedure: APPLICATION OF ROBOTIC ASSISTANCE FOR SPINAL PROCEDURE;  Surgeon: Ditty, Loura Halt, MD;  Location: [facility] OR;  Service: Neurosurgery;  Laterality: N/A;  . BACK SURGERY  2007  . CARDIAC CATHETERIZATION     02/2016  . CERVICAL SPINE SURGERY  08/2018  . COLONOSCOPY    . HAND TENDON SURGERY Right   . HEMORRHOID SURGERY  2005 - approximate  . SPHINCTEROTOMY  2007  . THORACIC DISCECTOMY N/A 01/13/2018   Procedure: Laminectomy - Thoracic ten-Thoracic eleven;  Surgeon: Julio Sicks, MD;  Location: [facility] OR;  Service:  Neurosurgery;  Laterality: N/A;  . TRANSFORAMINAL LUMBAR INTERBODY FUSION (TLIF) WITH PEDICLE SCREW FIXATION 3 LEVEL  01/28/2017   Procedure: Lumbar Three-Sacral One Laminectomy for decompression with posterior column osteotomies, Lumbar five-Sacral One Transforaminal lumbar interbody fusion/Lumbar Three-Sacral One Posterior segmental instrumented fusion;  Surgeon: Ditty, Loura Halt, MD;  Location: [facility] OR;  Service: Neurosurgery;;    FAMILY HISTORY: Family History  Problem Relation Age of Onset  . Heart disease Mother   . Prostate cancer Father     SOCIAL HISTORY: Social History   Socioeconomic History  . Marital status: Married    Spouse name: [name]  . Number of children: 1  . Years of education: 47  . Highest education level: Not on file  Occupational History    Comment: NA  Tobacco Use  . Smoking status: Current Every Day Smoker    Packs/day: 1.00    Types: Cigarettes  . Smokeless tobacco: Never Used  Vaping Use  . Vaping Use: Never used  Substance and Sexual Activity  . Alcohol use: Yes    Comment:  rare  . Drug use: No  . Sexual activity: Not on file  Other Topics Concern  . Not on file  Social History Narrative   Lives wife, Magda Paganiniudrey at home.  Cannot work.  Education HS.  [employer].     Caffeine one cup daily.   Social Determinants of Health   Financial Resource Strain:   . Difficulty of Paying Living Expenses:   Food Insecurity:   . Worried About Programme researcher, broadcasting/film/videounning Out of Food in the Last Year:   . Baristaan Out of Food in the Last Year:   Transportation Needs:   . Freight forwarderLack of Transportation (Medical):   Marland Kitchen. Lack of Transportation (Non-Medical):   Physical Activity:   . Days of Exercise per Week:   . Minutes of Exercise per Session:   Stress:   . Feeling of Stress :   Social Connections:   . Frequency of Communication with Friends and Family:   . Frequency of Social Gatherings with Friends and Family:   . Attends Religious Services:   . Active Member of Clubs or  Organizations:   . Attends BankerClub or Organization Meetings:   Marland Kitchen. Marital Status:   Intimate Partner Violence:   . Fear of Current or Ex-Partner:   . Emotionally Abused:   Marland Kitchen. Physically Abused:   . Sexually Abused:      PHYSICAL EXAM  GENERAL EXAM/CONSTITUTIONAL: Vitals:  Vitals:   09/15/19 1017  BP: (!) 139/91  Pulse: 94  Weight: 170 lb 9.6 oz (77.4 kg)  Height: 6\' 2"  (1.88 m)   Body mass index is 21.9 kg/m. Wt Readings from Last 3 Encounters:  09/15/19 170 lb 9.6 oz (77.4 kg)  04/11/18 175 lb 3.2 oz (79.5 kg)  01/13/18 182 lb 6.4 oz (82.7 kg)    Patient is in no distress; well developed, nourished and groomed; neck is supple  CARDIOVASCULAR:  Examination of carotid arteries is normal; no carotid bruits  Regular rate and rhythm, no murmurs  Examination of peripheral vascular system by observation and palpation is normal  EYES:  Ophthalmoscopic exam of optic discs and posterior segments is normal; no papilledema or hemorrhages No exam data present  MUSCULOSKELETAL:  Gait, strength, tone, movements noted in Neurologic exam below  NEUROLOGIC: MENTAL STATUS:  No flowsheet data found.  awake, alert, oriented to person, place and time  recent and remote memory intact  normal attention and concentration  language fluent, comprehension intact, naming intact  fund of knowledge appropriate  CRANIAL NERVE:   2nd - no papilledema on fundoscopic exam  2nd, 3rd, 4th, 6th - pupils equal and reactive to light, visual fields full to confrontation, extraocular muscles intact, no nystagmus  5th - facial sensation symmetric  7th - facial strength symmetric  8th - hearing intact  9th - palate elevates symmetrically, uvula midline  11th - shoulder shrug symmetric  12th - tongue protrusion midline  MOTOR:   normal bulk and tone, full strength in the BUE  INCREASED TONE IN BLE --> HF 3, KE 4, KF 4, DF 3  CLONUS IN RIGHT > LEFT ANKLES  SENSORY:   normal  and symmetric to light touch; DECR IN BLE   COORDINATION:   finger-nose-finger, fine finger movements --> normal in BUE  REFLEXES:   deep tendon reflexes --> BUE 2; KNEES 3 (POSITIVE SUPRAPATELLAR AND CROSSED ADDUCTORS); ANKLES 3  GAIT/STATION:   SPASTIC, UNSTEADY GAIT; NARROW SCISSORING GAIT     DIAGNOSTIC DATA (LABS, IMAGING, TESTING) - I reviewed patient records, labs,  notes, testing and imaging myself where available.  Lab Results  Component Value Date   WBC 8.8 01/03/2018   HGB 15.0 01/03/2018   HCT 46.9 01/03/2018   MCV 94.6 01/03/2018   PLT 457 (H) 01/03/2018      Component Value Date/Time   NA 139 01/03/2018 0908   NA 143 11/19/2017 1632   K 4.3 01/03/2018 0908   CL 106 01/03/2018 0908   CO2 24 01/03/2018 0908   GLUCOSE 100 (H) 01/03/2018 0908   BUN 10 01/03/2018 0908   BUN 13 11/19/2017 1632   CREATININE 0.89 01/03/2018 0908   CALCIUM 9.6 01/03/2018 0908   PROT 7.6 11/19/2017 1632   ALBUMIN 4.9 11/19/2017 1632   AST 15 11/19/2017 1632   ALT 9 11/19/2017 1632   ALKPHOS 77 11/19/2017 1632   BILITOT 0.3 11/19/2017 1632   GFRNONAA >60 01/03/2018 0908   GFRAA >60 01/03/2018 0908   No results found for: CHOL, HDL, LDLCALC, LDLDIRECT, TRIG, CHOLHDL Lab Results  Component Value Date   HGBA1C 5.6 11/19/2017   Lab Results  Component Value Date   VITAMINB12 670 11/19/2017   Lab Results  Component Value Date   TSH 5.240 (H) 11/19/2017     12/31/16 CT THORACIC SPINE [I reviewed images myself and agree with interpretation. In addition, there is moderate spinal stenosis at T7, mild spinal stenosis at T7-8, T8-9 levels and moderate-severe spinal stenosis at T10-11 level. -VRP]  - No fracture or focal lesion. Minimal curvature. No significant disc finding. Thoracic facet arthritis from T4-5 through T11-12.  12/31/16 CT LUMBAR SPINE [I reviewed images myself and agree with interpretation. Also large anterior bone bridging at L3-4.-VRP]  - Previous left  hemilaminectomy at L4-5 and L5-S1. - L3-4 disc protrusion more prominent towards the left with stenosis of both lateral recesses left more than right. - L4-5 lateral recess and foraminal narrowing. - L5-S1 lateral recess and foraminal narrowing.  11/20/17 MRI thoracic spine 1. The most significant central canal stenosis is at T10-11 with mild posterior central canal narrowing due to advanced facet hypertrophy. 2. Mild posterior encroachment at T7-8 secondary to facet hypertrophy without significant central canal stenosis. 3. Mild right foraminal narrowing at T4-5. 4. Moderate foraminal narrowing at T7-8 is worse on the right. 5. Moderate bilateral foraminal narrowing at T8-9. 6. Moderate foraminal narrowing bilaterally at T10-11 is worse on the left.   04/30/18 MRI brain - normal  04/30/18 MRI cervical  - At C6-7: disc bulging with facet hypertrophy with moderate spinal stenosis and severe biforaminal stenosis; intrinsic T2 hyperintensity consistent with myelomalacia. - At C5-6: disc bulging with mild biforaminal stenosis.  05/07/18 MRI lumbar spine - MRI scan lumbar spine showing stable changes of L3-S1 PLIF and laminectomy.  There are mild disc and facet degenerative changes at L1-L2 and L2-3 with mild posterior canal and bilateral foraminal narrowing.  No significant change compared with previous MRI dated 08/08/2017   ASSESSMENT AND PLAN  59 y.o. year old male here with history of lumbar spine surgery x2 in 2010 and 2018. Then progressive lower extremity numbness, weakness, spastic gait, hyperreflexia, incontinence, perineal anesthesia, s/p T10-11 decompression Nov 2019. Sxs continued to worsen, then s/p C6-7 ACDF in June 2020.   Dx:  1. Weakness of both lower extremities   2. Myelopathy (HCC)   3. Myelopathy of thoracic region   4. Numbness      PLAN:  LOWER EXTREMITY WEAKNESS / HYPERREFLEXIA / BOWEL DYSFUNCTION / GENITAL NUMBNESS (h/o cervical and thoracic myelopathies  s/p  decompression; h/o lumbar spinal stenosis s/p surgeries x 2) - check labs to rule out other causes of weakness; suspect sxs are still mainly related to prior cervical, thoracic and lumbar spine issues - continue supportive care; pain mgmt, PT, rollator  Orders Placed This Encounter  Procedures  . Vitamin B12  . Ceruloplasmin  . Copper, serum  . Vitamin B1  . Hemoglobin A1c  . TSH   Return for pending if symptoms worsen or fail to improve, return to PCP.    Suanne Marker, MD 09/15/2019, 10:29 AM Certified in Neurology, Neurophysiology and Neuroimaging  [facility] [address], [address] [place], [place] [address] [phone]

## 2019-09-20 LAB — VITAMIN B1: Thiamine: 114.1 nmol/L (ref 66.5–200.0)

## 2019-09-20 LAB — HEMOGLOBIN A1C
Est. average glucose Bld gHb Est-mCnc: 117 mg/dL
Hgb A1c MFr Bld: 5.7 % — ABNORMAL HIGH (ref 4.8–5.6)

## 2019-09-20 LAB — CERULOPLASMIN: Ceruloplasmin: 22.5 mg/dL (ref 16.0–31.0)

## 2019-09-20 LAB — COPPER, SERUM: Copper: 114 ug/dL (ref 69–132)

## 2019-09-20 LAB — TSH: TSH: 3.18 u[IU]/mL (ref 0.450–4.500)

## 2019-09-20 LAB — VITAMIN B12: Vitamin B-12: 521 pg/mL (ref 232–1245)

## 2019-09-23 ENCOUNTER — Telehealth: Payer: Self-pay | Admitting: *Deleted

## 2019-09-23 NOTE — Telephone Encounter (Signed)
LVM  Informing labs look good. Left # for questions.

## 2019-09-30 DIAGNOSIS — K5900 Constipation, unspecified: Secondary | ICD-10-CM | POA: Diagnosis not present

## 2019-09-30 DIAGNOSIS — R634 Abnormal weight loss: Secondary | ICD-10-CM | POA: Diagnosis not present

## 2019-10-20 DIAGNOSIS — J3489 Other specified disorders of nose and nasal sinuses: Secondary | ICD-10-CM | POA: Diagnosis not present

## 2019-10-20 DIAGNOSIS — Z1159 Encounter for screening for other viral diseases: Secondary | ICD-10-CM | POA: Diagnosis not present

## 2019-10-20 DIAGNOSIS — Z20828 Contact with and (suspected) exposure to other viral communicable diseases: Secondary | ICD-10-CM | POA: Diagnosis not present

## 2019-10-23 DIAGNOSIS — E039 Hypothyroidism, unspecified: Secondary | ICD-10-CM | POA: Diagnosis not present

## 2019-10-23 DIAGNOSIS — F17210 Nicotine dependence, cigarettes, uncomplicated: Secondary | ICD-10-CM | POA: Diagnosis not present

## 2019-10-23 DIAGNOSIS — Z880 Allergy status to penicillin: Secondary | ICD-10-CM | POA: Diagnosis not present

## 2019-10-23 DIAGNOSIS — F329 Major depressive disorder, single episode, unspecified: Secondary | ICD-10-CM | POA: Diagnosis not present

## 2019-10-23 DIAGNOSIS — Z86010 Personal history of colonic polyps: Secondary | ICD-10-CM | POA: Diagnosis not present

## 2019-10-23 DIAGNOSIS — Z09 Encounter for follow-up examination after completed treatment for conditions other than malignant neoplasm: Secondary | ICD-10-CM | POA: Diagnosis not present

## 2019-12-04 DIAGNOSIS — M25552 Pain in left hip: Secondary | ICD-10-CM | POA: Diagnosis not present

## 2019-12-11 DIAGNOSIS — M1612 Unilateral primary osteoarthritis, left hip: Secondary | ICD-10-CM | POA: Diagnosis not present

## 2019-12-11 DIAGNOSIS — M25552 Pain in left hip: Secondary | ICD-10-CM | POA: Diagnosis not present

## 2020-01-13 DIAGNOSIS — G959 Disease of spinal cord, unspecified: Secondary | ICD-10-CM | POA: Diagnosis not present

## 2020-01-18 DIAGNOSIS — M1612 Unilateral primary osteoarthritis, left hip: Secondary | ICD-10-CM | POA: Diagnosis not present

## 2020-01-20 DIAGNOSIS — N529 Male erectile dysfunction, unspecified: Secondary | ICD-10-CM | POA: Diagnosis not present

## 2020-01-20 DIAGNOSIS — Z1331 Encounter for screening for depression: Secondary | ICD-10-CM | POA: Diagnosis not present

## 2020-01-20 DIAGNOSIS — F331 Major depressive disorder, recurrent, moderate: Secondary | ICD-10-CM | POA: Diagnosis not present

## 2020-01-20 DIAGNOSIS — R634 Abnormal weight loss: Secondary | ICD-10-CM | POA: Diagnosis not present

## 2020-01-20 DIAGNOSIS — M25552 Pain in left hip: Secondary | ICD-10-CM | POA: Diagnosis not present

## 2020-01-20 DIAGNOSIS — E039 Hypothyroidism, unspecified: Secondary | ICD-10-CM | POA: Diagnosis not present

## 2020-01-20 DIAGNOSIS — F17200 Nicotine dependence, unspecified, uncomplicated: Secondary | ICD-10-CM | POA: Diagnosis not present

## 2020-01-22 DIAGNOSIS — R634 Abnormal weight loss: Secondary | ICD-10-CM | POA: Diagnosis not present

## 2020-01-27 DIAGNOSIS — M25552 Pain in left hip: Secondary | ICD-10-CM | POA: Diagnosis not present

## 2020-02-23 DIAGNOSIS — F331 Major depressive disorder, recurrent, moderate: Secondary | ICD-10-CM | POA: Diagnosis not present

## 2020-02-23 DIAGNOSIS — N529 Male erectile dysfunction, unspecified: Secondary | ICD-10-CM | POA: Diagnosis not present

## 2020-04-13 DIAGNOSIS — G959 Disease of spinal cord, unspecified: Secondary | ICD-10-CM | POA: Diagnosis not present

## 2020-05-09 DIAGNOSIS — R2689 Other abnormalities of gait and mobility: Secondary | ICD-10-CM | POA: Diagnosis not present

## 2020-05-09 DIAGNOSIS — G959 Disease of spinal cord, unspecified: Secondary | ICD-10-CM | POA: Diagnosis not present

## 2020-05-09 DIAGNOSIS — M25552 Pain in left hip: Secondary | ICD-10-CM | POA: Diagnosis not present

## 2020-05-09 DIAGNOSIS — M6281 Muscle weakness (generalized): Secondary | ICD-10-CM | POA: Diagnosis not present

## 2020-05-16 DIAGNOSIS — M25552 Pain in left hip: Secondary | ICD-10-CM | POA: Diagnosis not present

## 2020-05-16 DIAGNOSIS — R2689 Other abnormalities of gait and mobility: Secondary | ICD-10-CM | POA: Diagnosis not present

## 2020-05-16 DIAGNOSIS — M6281 Muscle weakness (generalized): Secondary | ICD-10-CM | POA: Diagnosis not present

## 2020-05-16 DIAGNOSIS — G959 Disease of spinal cord, unspecified: Secondary | ICD-10-CM | POA: Diagnosis not present

## 2020-05-20 DIAGNOSIS — B9689 Other specified bacterial agents as the cause of diseases classified elsewhere: Secondary | ICD-10-CM | POA: Diagnosis not present

## 2020-05-20 DIAGNOSIS — J019 Acute sinusitis, unspecified: Secondary | ICD-10-CM | POA: Diagnosis not present

## 2020-05-30 DIAGNOSIS — R2689 Other abnormalities of gait and mobility: Secondary | ICD-10-CM | POA: Diagnosis not present

## 2020-05-30 DIAGNOSIS — M25552 Pain in left hip: Secondary | ICD-10-CM | POA: Diagnosis not present

## 2020-05-30 DIAGNOSIS — M6281 Muscle weakness (generalized): Secondary | ICD-10-CM | POA: Diagnosis not present

## 2020-05-30 DIAGNOSIS — G959 Disease of spinal cord, unspecified: Secondary | ICD-10-CM | POA: Diagnosis not present

## 2020-06-01 DIAGNOSIS — M6281 Muscle weakness (generalized): Secondary | ICD-10-CM | POA: Diagnosis not present

## 2020-06-01 DIAGNOSIS — G959 Disease of spinal cord, unspecified: Secondary | ICD-10-CM | POA: Diagnosis not present

## 2020-06-01 DIAGNOSIS — R2689 Other abnormalities of gait and mobility: Secondary | ICD-10-CM | POA: Diagnosis not present

## 2020-06-01 DIAGNOSIS — M25552 Pain in left hip: Secondary | ICD-10-CM | POA: Diagnosis not present

## 2020-06-06 DIAGNOSIS — M25552 Pain in left hip: Secondary | ICD-10-CM | POA: Diagnosis not present

## 2020-06-06 DIAGNOSIS — M6281 Muscle weakness (generalized): Secondary | ICD-10-CM | POA: Diagnosis not present

## 2020-06-06 DIAGNOSIS — G959 Disease of spinal cord, unspecified: Secondary | ICD-10-CM | POA: Diagnosis not present

## 2020-06-06 DIAGNOSIS — R2689 Other abnormalities of gait and mobility: Secondary | ICD-10-CM | POA: Diagnosis not present

## 2020-06-08 DIAGNOSIS — R2689 Other abnormalities of gait and mobility: Secondary | ICD-10-CM | POA: Diagnosis not present

## 2020-06-08 DIAGNOSIS — G959 Disease of spinal cord, unspecified: Secondary | ICD-10-CM | POA: Diagnosis not present

## 2020-06-08 DIAGNOSIS — M25552 Pain in left hip: Secondary | ICD-10-CM | POA: Diagnosis not present

## 2020-06-08 DIAGNOSIS — M6281 Muscle weakness (generalized): Secondary | ICD-10-CM | POA: Diagnosis not present

## 2020-06-30 DIAGNOSIS — M47817 Spondylosis without myelopathy or radiculopathy, lumbosacral region: Secondary | ICD-10-CM | POA: Diagnosis not present

## 2020-06-30 DIAGNOSIS — G629 Polyneuropathy, unspecified: Secondary | ICD-10-CM | POA: Diagnosis not present

## 2020-06-30 DIAGNOSIS — I739 Peripheral vascular disease, unspecified: Secondary | ICD-10-CM | POA: Diagnosis not present

## 2020-07-08 DIAGNOSIS — M79673 Pain in unspecified foot: Secondary | ICD-10-CM | POA: Diagnosis not present

## 2020-07-08 DIAGNOSIS — I739 Peripheral vascular disease, unspecified: Secondary | ICD-10-CM | POA: Diagnosis not present

## 2020-08-03 DIAGNOSIS — E785 Hyperlipidemia, unspecified: Secondary | ICD-10-CM | POA: Diagnosis not present

## 2020-08-03 DIAGNOSIS — Z9181 History of falling: Secondary | ICD-10-CM | POA: Diagnosis not present

## 2020-08-03 DIAGNOSIS — Z1331 Encounter for screening for depression: Secondary | ICD-10-CM | POA: Diagnosis not present

## 2020-08-03 DIAGNOSIS — Z0000 Encounter for general adult medical examination without abnormal findings: Secondary | ICD-10-CM | POA: Diagnosis not present

## 2020-08-17 DIAGNOSIS — M4804 Spinal stenosis, thoracic region: Secondary | ICD-10-CM | POA: Diagnosis not present

## 2020-08-17 DIAGNOSIS — M5416 Radiculopathy, lumbar region: Secondary | ICD-10-CM | POA: Diagnosis not present

## 2020-08-18 ENCOUNTER — Other Ambulatory Visit: Payer: Self-pay | Admitting: Neurosurgery

## 2020-08-18 DIAGNOSIS — M5416 Radiculopathy, lumbar region: Secondary | ICD-10-CM

## 2020-08-18 DIAGNOSIS — M4804 Spinal stenosis, thoracic region: Secondary | ICD-10-CM

## 2020-08-22 ENCOUNTER — Telehealth: Payer: Self-pay

## 2020-08-22 NOTE — Telephone Encounter (Signed)
Phone call to patient to verify medication list and allergies for myelogram procedure. Medications pt is currently taking are safe to continue to take. Advised pt if any new medications are started prior to procedure to call and make us aware. Pt also instructed to have a driver the day of the procedure and discharge instructions. Pt verbalized understanding. 

## 2020-08-31 ENCOUNTER — Ambulatory Visit
Admission: RE | Admit: 2020-08-31 | Discharge: 2020-08-31 | Disposition: A | Discharge status: Home / Self Care | Payer: PPO | Source: Ambulatory Visit | Attending: Neurosurgery | Admitting: Neurosurgery

## 2020-08-31 DIAGNOSIS — M4804 Spinal stenosis, thoracic region: Secondary | ICD-10-CM

## 2020-08-31 DIAGNOSIS — M4326 Fusion of spine, lumbar region: Secondary | ICD-10-CM | POA: Diagnosis not present

## 2020-08-31 DIAGNOSIS — M4134 Thoracogenic scoliosis, thoracic region: Secondary | ICD-10-CM | POA: Diagnosis not present

## 2020-08-31 DIAGNOSIS — M5416 Radiculopathy, lumbar region: Secondary | ICD-10-CM

## 2020-08-31 DIAGNOSIS — M546 Pain in thoracic spine: Secondary | ICD-10-CM | POA: Diagnosis not present

## 2020-08-31 DIAGNOSIS — M5124 Other intervertebral disc displacement, thoracic region: Secondary | ICD-10-CM | POA: Diagnosis not present

## 2020-08-31 DIAGNOSIS — M48061 Spinal stenosis, lumbar region without neurogenic claudication: Secondary | ICD-10-CM | POA: Diagnosis not present

## 2020-08-31 MED ORDER — IOPAMIDOL (ISOVUE-M 300) INJECTION 61%
10.0000 mL | Freq: Once | INTRAMUSCULAR | Status: AC | PRN
  Administered 2020-08-31: 10 mL via INTRATHECAL

## 2020-08-31 MED ORDER — DIAZEPAM 5 MG PO TABS
10.0000 mg | ORAL_TABLET | Freq: Once | ORAL | Status: AC
  Administered 2020-08-31: 10 mg via ORAL

## 2020-08-31 NOTE — Discharge Instructions (Signed)

## 2020-09-19 DIAGNOSIS — G959 Disease of spinal cord, unspecified: Secondary | ICD-10-CM | POA: Diagnosis not present

## 2020-09-19 DIAGNOSIS — R030 Elevated blood-pressure reading, without diagnosis of hypertension: Secondary | ICD-10-CM | POA: Diagnosis not present

## 2020-10-13 DIAGNOSIS — M461 Sacroiliitis, not elsewhere classified: Secondary | ICD-10-CM | POA: Diagnosis not present

## 2021-02-10 DIAGNOSIS — Z125 Encounter for screening for malignant neoplasm of prostate: Secondary | ICD-10-CM | POA: Diagnosis not present

## 2021-02-10 DIAGNOSIS — Z0000 Encounter for general adult medical examination without abnormal findings: Secondary | ICD-10-CM | POA: Diagnosis not present

## 2021-03-22 DIAGNOSIS — G959 Disease of spinal cord, unspecified: Secondary | ICD-10-CM | POA: Diagnosis not present

## 2021-08-18 DIAGNOSIS — M170 Bilateral primary osteoarthritis of knee: Secondary | ICD-10-CM | POA: Diagnosis not present

## 2021-08-18 DIAGNOSIS — F410 Panic disorder [episodic paroxysmal anxiety] without agoraphobia: Secondary | ICD-10-CM | POA: Diagnosis not present

## 2021-08-18 DIAGNOSIS — F17200 Nicotine dependence, unspecified, uncomplicated: Secondary | ICD-10-CM | POA: Diagnosis not present

## 2021-08-18 DIAGNOSIS — M160 Bilateral primary osteoarthritis of hip: Secondary | ICD-10-CM | POA: Diagnosis not present

## 2021-08-18 DIAGNOSIS — M5137 Other intervertebral disc degeneration, lumbosacral region: Secondary | ICD-10-CM | POA: Diagnosis not present

## 2021-08-18 DIAGNOSIS — F411 Generalized anxiety disorder: Secondary | ICD-10-CM | POA: Diagnosis not present

## 2021-08-18 DIAGNOSIS — G2581 Restless legs syndrome: Secondary | ICD-10-CM | POA: Diagnosis not present

## 2021-08-18 DIAGNOSIS — N529 Male erectile dysfunction, unspecified: Secondary | ICD-10-CM | POA: Diagnosis not present

## 2021-09-20 DIAGNOSIS — G959 Disease of spinal cord, unspecified: Secondary | ICD-10-CM | POA: Diagnosis not present

## 2021-12-10 IMAGING — XA DG MYELOGRAPHY LUMBAR INJ MULTI REGION
12 of 23 series · 12 of 23 positions shown · non-contrast
Comparison: Lumbar spine MRI 05/07/2018. Thoracic spine MRI
07/29/2019.

CLINICAL DATA: Thoracic and lumbar back pain. Prior cervical,
thoracic, and lumbar surgery.
TECHNIQUE: Contiguous axial images were obtained through the thoracic and
lumbar spine after the intrathecal infusion of contrast. Coronal and
sagittal reconstructions were obtained of the axial image sets.

[Series 1: w lumbar spine lat · 0.15mm/px · 1 of 1 slices shown]
[im 1/1]
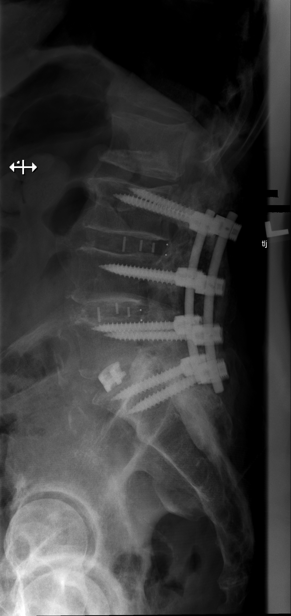

[Series 2: w lumbar spine flexion · 0.15mm/px · 1 of 1 slices shown]
[im 1/1]
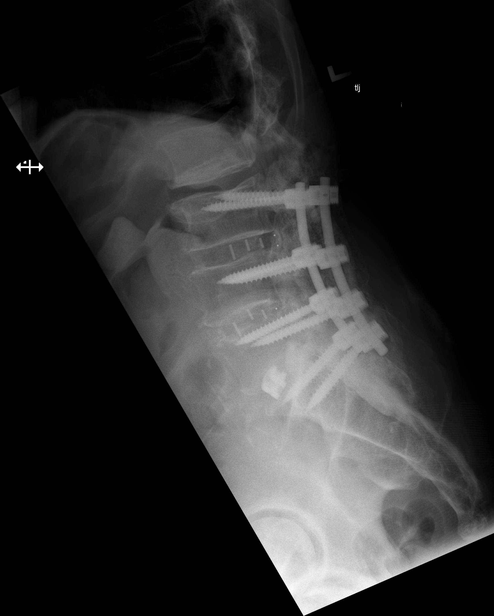

[Series 3: vasc standard · 1 of 1 slices shown (1 of 10)]
[im 1/1]
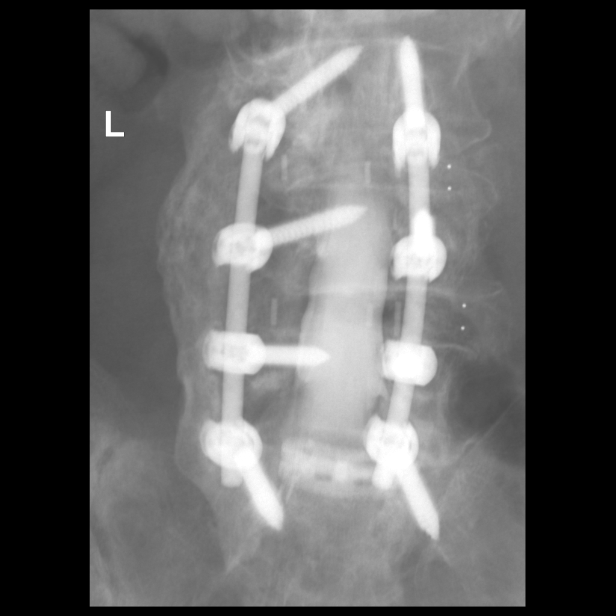

[Series 4: vasc standard · 1 of 1 slices shown (2 of 10)]
[im 1/1]
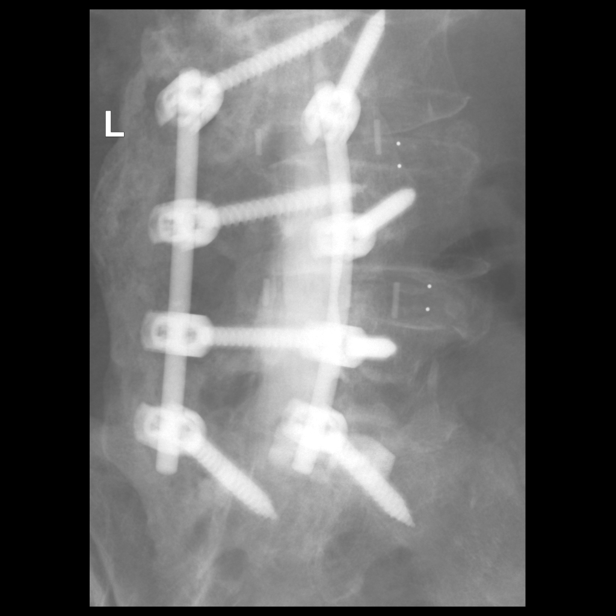

[Series 6: vasc standard · 1 of 1 slices shown (3 of 10)]
[im 1/1]
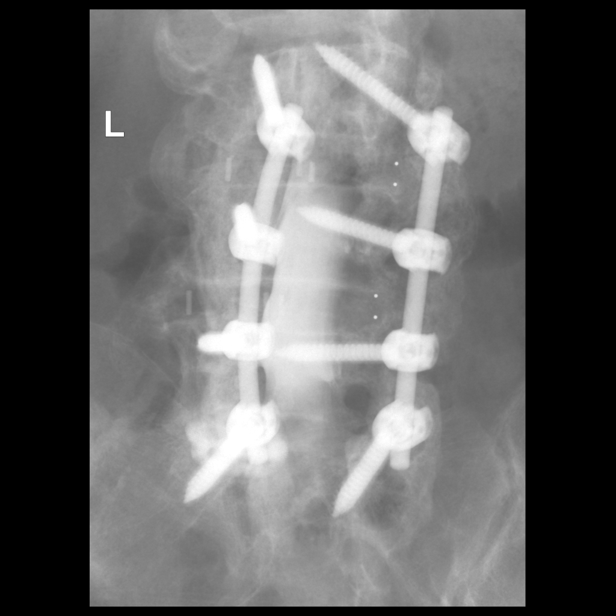

[Series 8: vasc standard · 1 of 1 slices shown (4 of 10)]
[im 1/1]
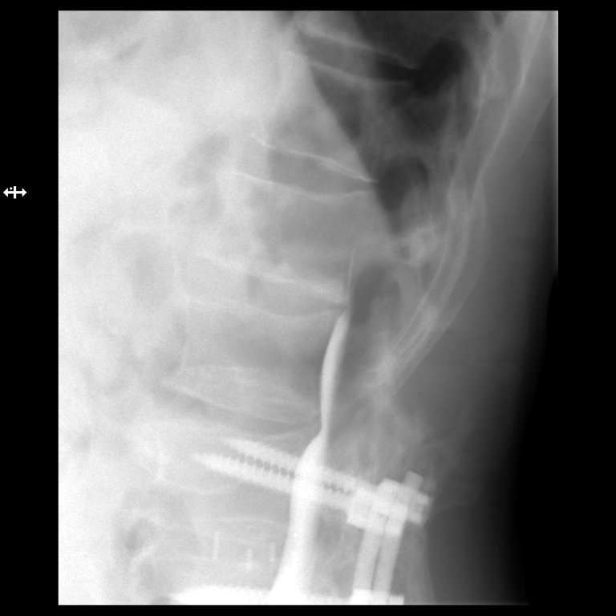

[Series 10: vasc standard · 1 of 1 slices shown (5 of 10)]
[im 1/1]
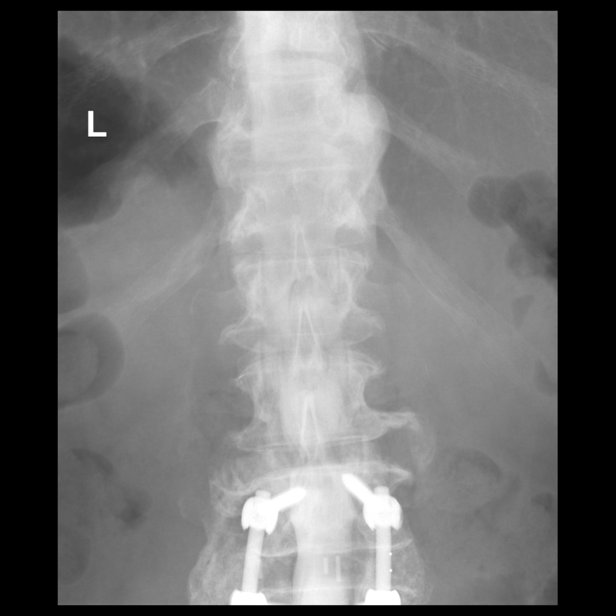

[Series 12: vasc standard · 1 of 1 slices shown (6 of 10)]
[im 1/1]
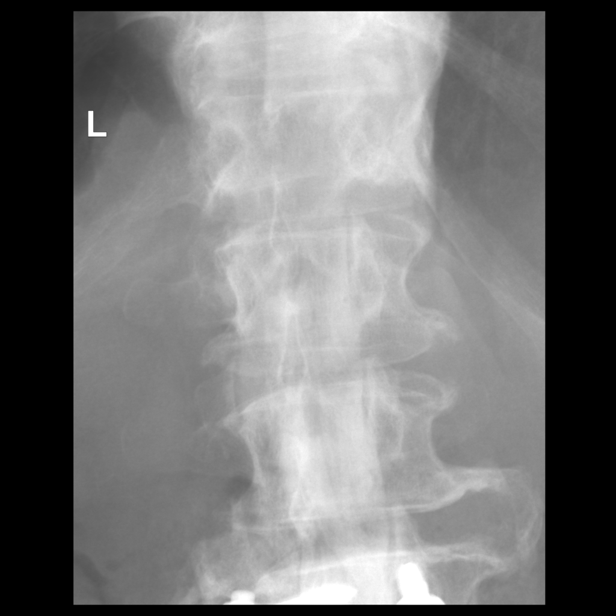

[Series 14: vasc standard · 1 of 1 slices shown (7 of 10)]
[im 1/1]
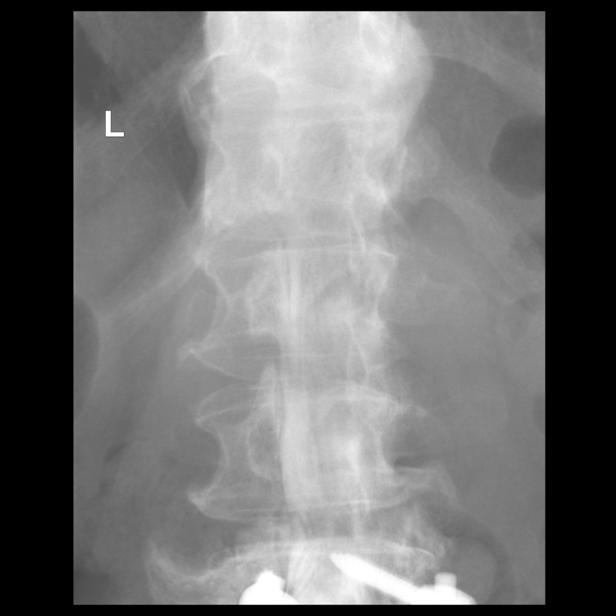

[Series 16: vasc standard · 1 of 1 slices shown (8 of 10)]
[im 1/1]
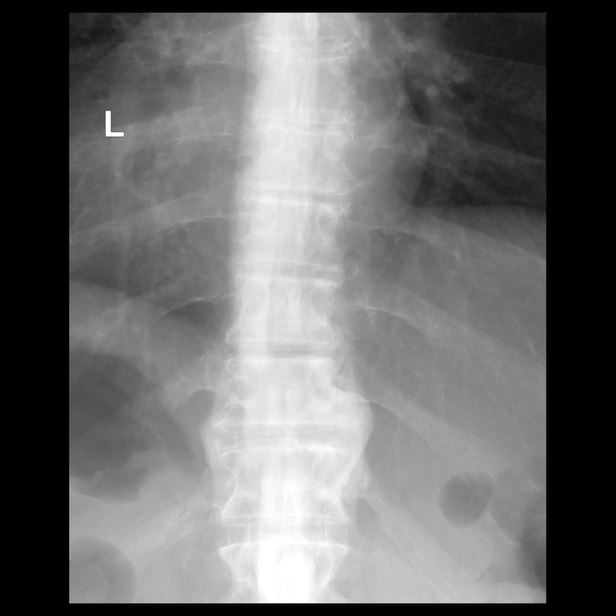

[Series 18: vasc standard · 1 of 1 slices shown (9 of 10)]
[im 1/1]
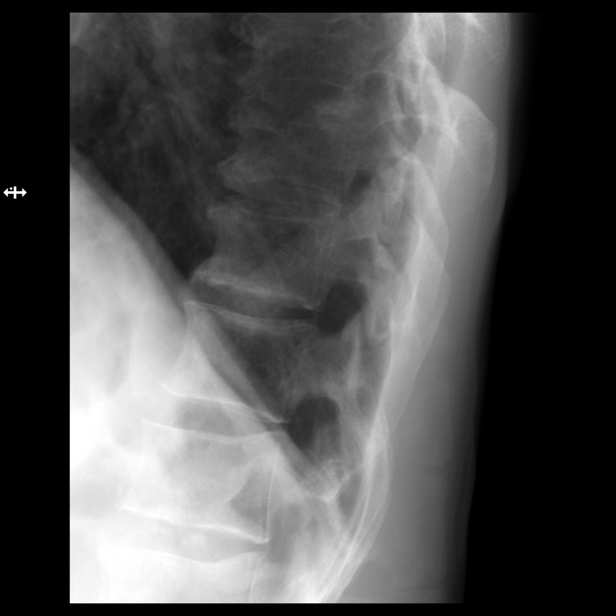

[Series 20: vasc standard · 1 of 1 slices shown (10 of 10)]
[im 1/1]
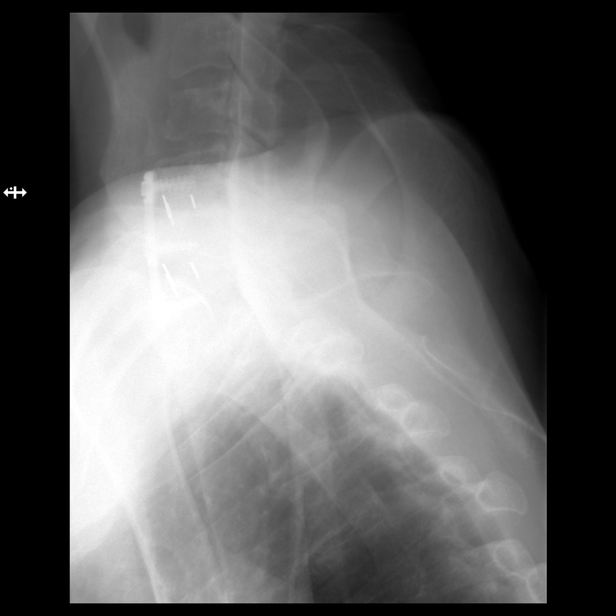

[12 of 23 positions shown; findings below may reference images not displayed]

FLUOROSCOPY TIME:  Fluoroscopy Time: 1 minute 13 seconds

Radiation Exposure Index: 433.22 microGray*m^2

PROCEDURE:
LUMBAR PUNCTURE FOR THORACIC AND LUMBAR MYELOGRAM

After thorough discussion of risks and benefits of the procedure
including bleeding, infection, injury to nerves, blood vessels,
adjacent structures as well as headache and CSF leak, written and
oral informed consent was obtained. Consent was obtained by Dr.
Bastian Ignacio Monje.

Patient was positioned prone on the fluoroscopy table. Local
anesthesia was provided with 1% lidocaine without epinephrine after
prepped and draped in the usual sterile fashion. Puncture was
performed at L5-S1 using a 3 inch 22-gauge spinal needle via a
midline approach. Using a single pass through the dura, the needle
was placed within the thecal sac, with return of clear CSF. 10 mL of
Isovue [id number] was injected into the thecal sac, with normal
opacification of the nerve roots and cauda equina consistent with
free flow within the subarachnoid space. The patient was then moved
to the Trendelenburg position and contrast flowed into the thoracic
spine region.

I personally performed the lumbar puncture and administered the
intrathecal contrast. I also personally supervised acquisition of
the myelogram images.
FINDINGS: THORACIC AND LUMBAR MYELOGRAM FINDINGS:

Trace retrolisthesis of L3 on L4 does not change with flexion or
extension. L4-S1 fusion is noted with adequate patency of the thecal
sac at these levels. A small ventral extradural defect is present at
L2-3, and there is mild waist like narrowing of the thecal sac at
this level. No high-grade spinal stenosis is evident in the thoracic
spine.

CT THORACIC MYELOGRAM FINDINGS:

There is minor S-shaped thoracic scoliosis. There is no listhesis.
No fracture or suspicious osseous lesion is identified. The thoracic
spinal cord is normal in caliber. A 4 mm pulmonary nodule anteriorly
in the right lower lobe was also present on a 3277 thoracic spine CT
and is considered benign. Coronary atherosclerosis is noted.

C5-C7 ACDF is noted with solid interbody arthrodesis and patent
spinal canal at both levels.

C7-T1: Moderate facet arthrosis without disc herniation or stenosis.

T1-2 through T3-4: Negative.

T4-5: Severe right facet arthrosis result in mild right neural
foraminal stenosis, unchanged. No spinal stenosis.

T5-6: Mild facet arthrosis without stenosis, unchanged.

T6-7: Severe facet arthrosis result in mild bilateral neural
foraminal stenosis without spinal stenosis, unchanged.

T7-8: Severe facet arthrosis and ligamentum flavum hypertrophy
result in moderate to severe bilateral neural foraminal stenosis and
borderline spinal stenosis, unchanged.

T8-9: Minimal disc bulging, severe facet arthrosis, and ligamentum
flavum hypertrophy result in borderline to mild spinal stenosis and
moderate to severe bilateral neural foraminal stenosis, unchanged.

T9-10: Moderate facet arthrosis and ligamentum flavum hypertrophy
without significant stenosis, unchanged.

T10-11: Prior posterior decompression. Disc bulging and mild facet
arthrosis result in mild-to-moderate right and moderate left neural
foraminal stenosis without significant residual spinal stenosis,
unchanged.

T11-12: Moderate facet arthrosis without stenosis, unchanged.

CT LUMBAR MYELOGRAM FINDINGS:

There is slight right convex curvature of the lumbar spine, and
there is unchanged trace retrolisthesis of L3 on L4 compared to the
prior MRI. No acute fracture or suspicious osseous lesion is
evident.

Sequelae of L3-S1 posterior and interbody fusion are again
identified. There are solid posterolateral osseous fusion masses
bilaterally. There is robust osseous fusion across the posterior
L5-S1 disc space on the left, however only a small amount of
bridging bone is present across the L3-4 and L4-5 disc spaces. There
is a large solid bridging anterior vertebral osteophyte at L3-4.
There is no evidence of pedicle screw loosening.

The conus medullaris terminates at L2-3. Contrast is noted in the
midline posterior soft tissues at L5 along the needle track. There
is abdominal aortic atherosclerosis without aneurysm.

T12-L1: Mild facet arthrosis without disc herniation or stenosis.

L1-2: Mild disc bulging and mild facet and ligamentum flavum
hypertrophy without stenosis, unchanged.

L2-3: Disc bulging, mild ligamentum flavum hypertrophy, and severe
facet hypertrophy result in mild spinal stenosis, mild bilateral
lateral recess stenosis, and mild right neural foraminal stenosis,
unchanged.

L3-4: Prior posterior decompression and fusion. Widely patent spinal
canal. Mild left greater than right osseous neural foraminal
narrowing.

L4-5: Prior posterior decompression and fusion. Widely patent spinal
canal. Mild bilateral osseous neural foraminal narrowing.

L5-S1: Prior posterior decompression and fusion. Widely patent
spinal canal. Borderline to mild right and moderate left osseous
neural foraminal narrowing.
IMPRESSION: 1. Unchanged thoracic disc and facet degeneration with borderline to
mild spinal stenosis and moderate to severe neural foraminal
stenosis in the lower thoracic spine as detailed above.
2. Unchanged mild spinal stenosis and mild right neural foraminal
stenosis at L2-3.
3. L3-S1 fusion with widely patent spinal canal. Moderate left
osseous neural foraminal narrowing at L5-S1.
4. Aortic Atherosclerosis ([id number]-[id number]).

## 2021-12-10 IMAGING — CT CT L SPINE W/ CM
1 of 7 series · 6 of 14 positions shown, 8 images · non-contrast
Comparison: Lumbar spine MRI 05/07/2018. Thoracic spine MRI
07/29/2019.

CLINICAL DATA: Thoracic and lumbar back pain. Prior cervical,
thoracic, and lumbar surgery.
TECHNIQUE: Contiguous axial images were obtained through the thoracic and
lumbar spine after the intrathecal infusion of contrast. Coronal and
sagittal reconstructions were obtained of the axial image sets.

[Series 3: l spine soft · axial · 0.39mm/px · z∈[-707,-523]mm · 6 of 130 slices shown, 8 images]
[im 19/130  soft-tissue]
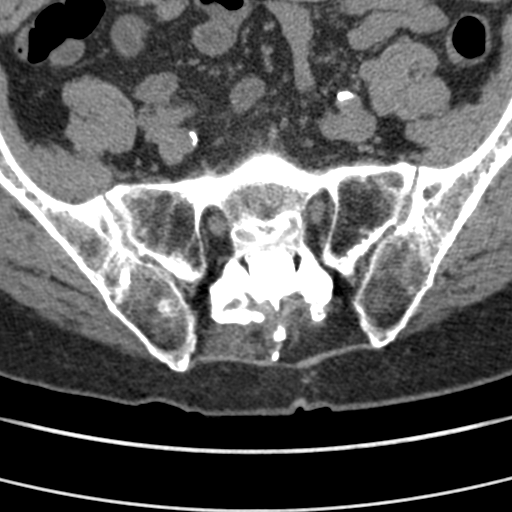
[im 19/130  bone]
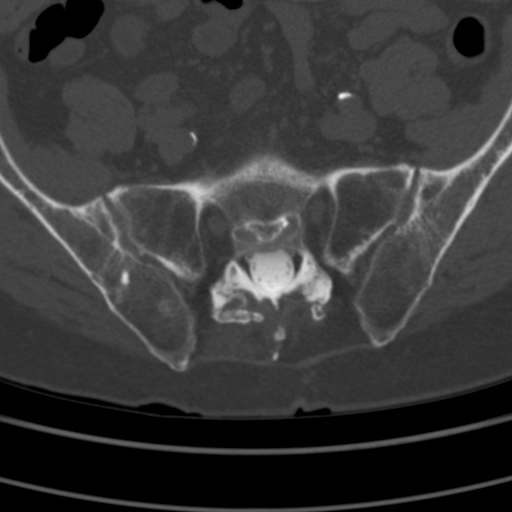
[im 37/130  bone]
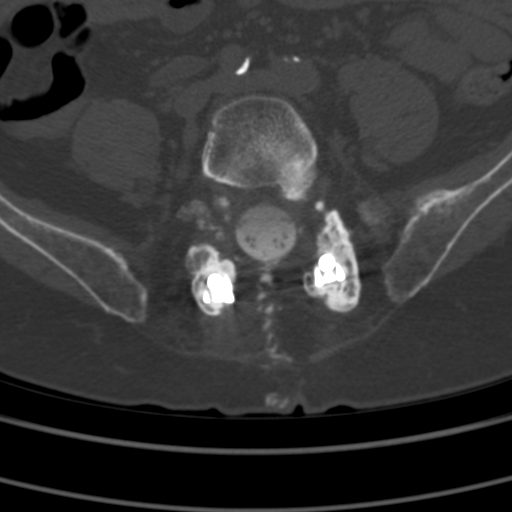
[im 56/130  bone]
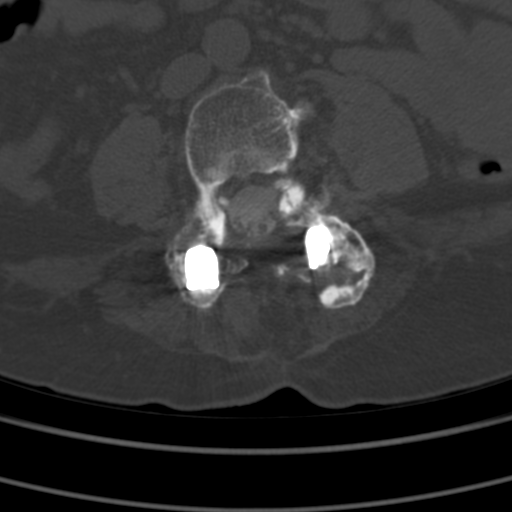
[im 74/130  bone]
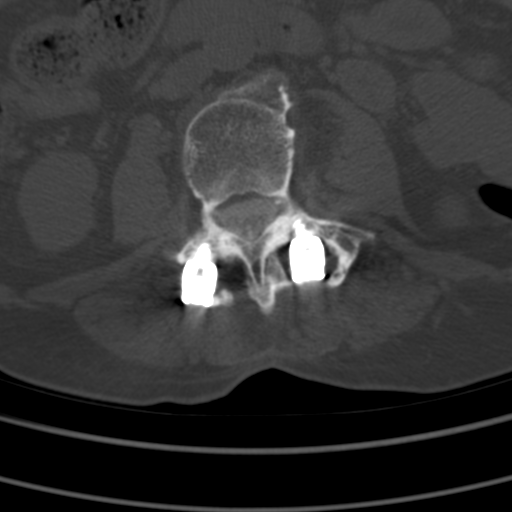
[im 93/130  soft-tissue]
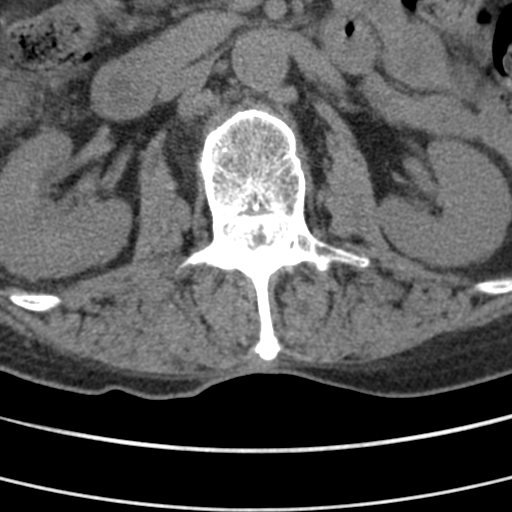
[im 93/130  bone]
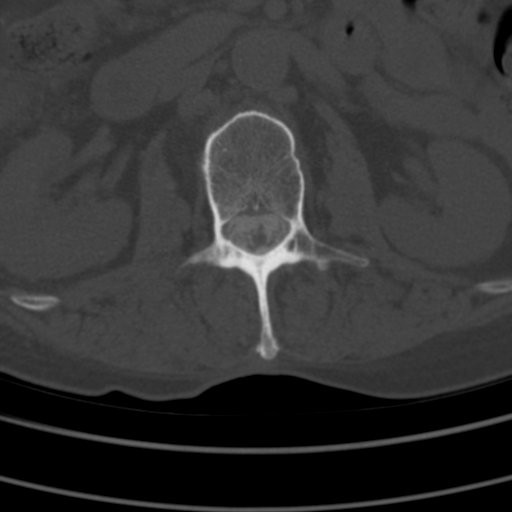
[im 111/130  bone]
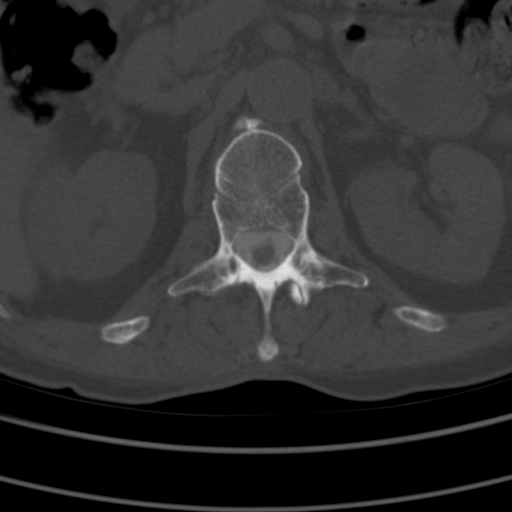

[6 of 14 positions shown; findings below may reference images not displayed]

FLUOROSCOPY TIME:  Fluoroscopy Time: 1 minute 13 seconds

Radiation Exposure Index: 433.22 microGray*m^2

PROCEDURE:
LUMBAR PUNCTURE FOR THORACIC AND LUMBAR MYELOGRAM

After thorough discussion of risks and benefits of the procedure
including bleeding, infection, injury to nerves, blood vessels,
adjacent structures as well as headache and CSF leak, written and
oral informed consent was obtained. Consent was obtained by Dr.
Bastian Ignacio Monje.

Patient was positioned prone on the fluoroscopy table. Local
anesthesia was provided with 1% lidocaine without epinephrine after
prepped and draped in the usual sterile fashion. Puncture was
performed at L5-S1 using a 3 inch 22-gauge spinal needle via a
midline approach. Using a single pass through the dura, the needle
was placed within the thecal sac, with return of clear CSF. 10 mL of
Isovue 5-A66 was injected into the thecal sac, with normal
opacification of the nerve roots and cauda equina consistent with
free flow within the subarachnoid space. The patient was then moved
to the Trendelenburg position and contrast flowed into the thoracic
spine region.

I personally performed the lumbar puncture and administered the
intrathecal contrast. I also personally supervised acquisition of
the myelogram images.
FINDINGS: THORACIC AND LUMBAR MYELOGRAM FINDINGS:

Trace retrolisthesis of L3 on L4 does not change with flexion or
extension. L4-S1 fusion is noted with adequate patency of the thecal
sac at these levels. A small ventral extradural defect is present at
L2-3, and there is mild waist like narrowing of the thecal sac at
this level. No high-grade spinal stenosis is evident in the thoracic
spine.

CT THORACIC MYELOGRAM FINDINGS:

There is minor S-shaped thoracic scoliosis. There is no listhesis.
No fracture or suspicious osseous lesion is identified. The thoracic
spinal cord is normal in caliber. A 4 mm pulmonary nodule anteriorly
in the right lower lobe was also present on a 3277 thoracic spine CT
and is considered benign. Coronary atherosclerosis is noted.

C5-C7 ACDF is noted with solid interbody arthrodesis and patent
spinal canal at both levels.

C7-T1: Moderate facet arthrosis without disc herniation or stenosis.

T1-2 through T3-4: Negative.

T4-5: Severe right facet arthrosis result in mild right neural
foraminal stenosis, unchanged. No spinal stenosis.

T5-6: Mild facet arthrosis without stenosis, unchanged.

T6-7: Severe facet arthrosis result in mild bilateral neural
foraminal stenosis without spinal stenosis, unchanged.

T7-8: Severe facet arthrosis and ligamentum flavum hypertrophy
result in moderate to severe bilateral neural foraminal stenosis and
borderline spinal stenosis, unchanged.

T8-9: Minimal disc bulging, severe facet arthrosis, and ligamentum
flavum hypertrophy result in borderline to mild spinal stenosis and
moderate to severe bilateral neural foraminal stenosis, unchanged.

T9-10: Moderate facet arthrosis and ligamentum flavum hypertrophy
without significant stenosis, unchanged.

T10-11: Prior posterior decompression. Disc bulging and mild facet
arthrosis result in mild-to-moderate right and moderate left neural
foraminal stenosis without significant residual spinal stenosis,
unchanged.

T11-12: Moderate facet arthrosis without stenosis, unchanged.

CT LUMBAR MYELOGRAM FINDINGS:

There is slight right convex curvature of the lumbar spine, and
there is unchanged trace retrolisthesis of L3 on L4 compared to the
prior MRI. No acute fracture or suspicious osseous lesion is
evident.

Sequelae of L3-S1 posterior and interbody fusion are again
identified. There are solid posterolateral osseous fusion masses
bilaterally. There is robust osseous fusion across the posterior
L5-S1 disc space on the left, however only a small amount of
bridging bone is present across the L3-4 and L4-5 disc spaces. There
is a large solid bridging anterior vertebral osteophyte at L3-4.
There is no evidence of pedicle screw loosening.

The conus medullaris terminates at L2-3. Contrast is noted in the
midline posterior soft tissues at L5 along the needle track. There
is abdominal aortic atherosclerosis without aneurysm.

T12-L1: Mild facet arthrosis without disc herniation or stenosis.

L1-2: Mild disc bulging and mild facet and ligamentum flavum
hypertrophy without stenosis, unchanged.

L2-3: Disc bulging, mild ligamentum flavum hypertrophy, and severe
facet hypertrophy result in mild spinal stenosis, mild bilateral
lateral recess stenosis, and mild right neural foraminal stenosis,
unchanged.

L3-4: Prior posterior decompression and fusion. Widely patent spinal
canal. Mild left greater than right osseous neural foraminal
narrowing.

L4-5: Prior posterior decompression and fusion. Widely patent spinal
canal. Mild bilateral osseous neural foraminal narrowing.

L5-S1: Prior posterior decompression and fusion. Widely patent
spinal canal. Borderline to mild right and moderate left osseous
neural foraminal narrowing.
IMPRESSION: 1. Unchanged thoracic disc and facet degeneration with borderline to
mild spinal stenosis and moderate to severe neural foraminal
stenosis in the lower thoracic spine as detailed above.
2. Unchanged mild spinal stenosis and mild right neural foraminal
stenosis at L2-3.
3. L3-S1 fusion with widely patent spinal canal. Moderate left
osseous neural foraminal narrowing at L5-S1.
4. Aortic Atherosclerosis ([id number]-[id number]).

## 2022-02-19 DIAGNOSIS — Z125 Encounter for screening for malignant neoplasm of prostate: Secondary | ICD-10-CM | POA: Diagnosis not present

## 2022-02-19 DIAGNOSIS — Z0000 Encounter for general adult medical examination without abnormal findings: Secondary | ICD-10-CM | POA: Diagnosis not present

## 2022-03-22 DIAGNOSIS — G959 Disease of spinal cord, unspecified: Secondary | ICD-10-CM | POA: Diagnosis not present

## 2022-08-21 DIAGNOSIS — G5791 Unspecified mononeuropathy of right lower limb: Secondary | ICD-10-CM | POA: Diagnosis not present

## 2022-08-21 DIAGNOSIS — N529 Male erectile dysfunction, unspecified: Secondary | ICD-10-CM | POA: Diagnosis not present

## 2022-08-21 DIAGNOSIS — M160 Bilateral primary osteoarthritis of hip: Secondary | ICD-10-CM | POA: Diagnosis not present

## 2022-08-21 DIAGNOSIS — M5137 Other intervertebral disc degeneration, lumbosacral region: Secondary | ICD-10-CM | POA: Diagnosis not present

## 2022-08-21 DIAGNOSIS — R630 Anorexia: Secondary | ICD-10-CM | POA: Diagnosis not present

## 2022-08-21 DIAGNOSIS — G2581 Restless legs syndrome: Secondary | ICD-10-CM | POA: Diagnosis not present

## 2022-08-21 DIAGNOSIS — L8990 Pressure ulcer of unspecified site, unspecified stage: Secondary | ICD-10-CM | POA: Diagnosis not present

## 2022-08-21 DIAGNOSIS — M170 Bilateral primary osteoarthritis of knee: Secondary | ICD-10-CM | POA: Diagnosis not present

## 2022-09-20 DIAGNOSIS — G959 Disease of spinal cord, unspecified: Secondary | ICD-10-CM | POA: Diagnosis not present

## 2022-10-22 DIAGNOSIS — J208 Acute bronchitis due to other specified organisms: Secondary | ICD-10-CM | POA: Diagnosis not present

## 2022-10-22 DIAGNOSIS — B9689 Other specified bacterial agents as the cause of diseases classified elsewhere: Secondary | ICD-10-CM | POA: Diagnosis not present

## 2022-11-09 DIAGNOSIS — M5137 Other intervertebral disc degeneration, lumbosacral region: Secondary | ICD-10-CM | POA: Diagnosis not present

## 2022-11-09 DIAGNOSIS — M47817 Spondylosis without myelopathy or radiculopathy, lumbosacral region: Secondary | ICD-10-CM | POA: Diagnosis not present

## 2022-11-09 DIAGNOSIS — R630 Anorexia: Secondary | ICD-10-CM | POA: Diagnosis not present

## 2022-11-09 DIAGNOSIS — J208 Acute bronchitis due to other specified organisms: Secondary | ICD-10-CM | POA: Diagnosis not present

## 2022-11-09 DIAGNOSIS — B9689 Other specified bacterial agents as the cause of diseases classified elsewhere: Secondary | ICD-10-CM | POA: Diagnosis not present

## 2022-11-09 DIAGNOSIS — M48061 Spinal stenosis, lumbar region without neurogenic claudication: Secondary | ICD-10-CM | POA: Diagnosis not present

## 2022-12-20 DIAGNOSIS — M5090 Cervical disc disorder, unspecified, unspecified cervical region: Secondary | ICD-10-CM | POA: Diagnosis not present

## 2022-12-28 DIAGNOSIS — I700 Atherosclerosis of aorta: Secondary | ICD-10-CM | POA: Diagnosis not present

## 2022-12-28 DIAGNOSIS — F439 Reaction to severe stress, unspecified: Secondary | ICD-10-CM | POA: Diagnosis not present

## 2022-12-28 DIAGNOSIS — Z122 Encounter for screening for malignant neoplasm of respiratory organs: Secondary | ICD-10-CM | POA: Diagnosis not present

## 2022-12-28 DIAGNOSIS — F17210 Nicotine dependence, cigarettes, uncomplicated: Secondary | ICD-10-CM | POA: Diagnosis not present

## 2023-04-19 DIAGNOSIS — G4700 Insomnia, unspecified: Secondary | ICD-10-CM | POA: Diagnosis not present

## 2023-04-19 DIAGNOSIS — R112 Nausea with vomiting, unspecified: Secondary | ICD-10-CM | POA: Diagnosis not present

## 2023-04-19 DIAGNOSIS — Z20822 Contact with and (suspected) exposure to covid-19: Secondary | ICD-10-CM | POA: Diagnosis not present

## 2023-04-19 DIAGNOSIS — R6889 Other general symptoms and signs: Secondary | ICD-10-CM | POA: Diagnosis not present

## 2023-04-25 DIAGNOSIS — G959 Disease of spinal cord, unspecified: Secondary | ICD-10-CM | POA: Diagnosis not present

## 2023-04-25 DIAGNOSIS — R200 Anesthesia of skin: Secondary | ICD-10-CM | POA: Diagnosis not present

## 2023-04-25 DIAGNOSIS — G629 Polyneuropathy, unspecified: Secondary | ICD-10-CM | POA: Diagnosis not present

## 2023-07-18 DIAGNOSIS — G894 Chronic pain syndrome: Secondary | ICD-10-CM | POA: Diagnosis not present

## 2023-07-18 DIAGNOSIS — G629 Polyneuropathy, unspecified: Secondary | ICD-10-CM | POA: Diagnosis not present

## 2023-07-18 DIAGNOSIS — Z125 Encounter for screening for malignant neoplasm of prostate: Secondary | ICD-10-CM | POA: Diagnosis not present

## 2023-07-18 DIAGNOSIS — R1011 Right upper quadrant pain: Secondary | ICD-10-CM | POA: Diagnosis not present

## 2023-07-18 DIAGNOSIS — J449 Chronic obstructive pulmonary disease, unspecified: Secondary | ICD-10-CM | POA: Diagnosis not present

## 2023-07-18 DIAGNOSIS — R229 Localized swelling, mass and lump, unspecified: Secondary | ICD-10-CM | POA: Diagnosis not present

## 2023-07-23 DIAGNOSIS — R1011 Right upper quadrant pain: Secondary | ICD-10-CM | POA: Diagnosis not present

## 2023-07-23 DIAGNOSIS — R1013 Epigastric pain: Secondary | ICD-10-CM | POA: Diagnosis not present

## 2023-07-23 DIAGNOSIS — K828 Other specified diseases of gallbladder: Secondary | ICD-10-CM | POA: Diagnosis not present

## 2023-07-23 DIAGNOSIS — K824 Cholesterolosis of gallbladder: Secondary | ICD-10-CM | POA: Diagnosis not present

## 2023-07-23 DIAGNOSIS — R160 Hepatomegaly, not elsewhere classified: Secondary | ICD-10-CM | POA: Diagnosis not present

## 2023-08-07 DIAGNOSIS — L723 Sebaceous cyst: Secondary | ICD-10-CM | POA: Diagnosis not present

## 2023-11-13 DIAGNOSIS — G959 Disease of spinal cord, unspecified: Secondary | ICD-10-CM | POA: Diagnosis not present

## 2023-11-13 DIAGNOSIS — G629 Polyneuropathy, unspecified: Secondary | ICD-10-CM | POA: Diagnosis not present

## 2023-11-26 DIAGNOSIS — G894 Chronic pain syndrome: Secondary | ICD-10-CM | POA: Diagnosis not present

## 2023-11-26 DIAGNOSIS — M51379 Other intervertebral disc degeneration, lumbosacral region without mention of lumbar back pain or lower extremity pain: Secondary | ICD-10-CM | POA: Diagnosis not present

## 2023-11-26 DIAGNOSIS — K047 Periapical abscess without sinus: Secondary | ICD-10-CM | POA: Diagnosis not present
